# Patient Record
Sex: Male | Born: 1960 | Race: Black or African American | Hispanic: No | Marital: Single | Smoking: Current some day smoker
Health system: Southern US, Community
[De-identification: ages and names within clinical notes are randomized; demographics above are authoritative.]

## PROBLEM LIST (undated history)

## (undated) DIAGNOSIS — I10 Essential (primary) hypertension: Secondary | ICD-10-CM

## (undated) HISTORY — PX: KNEE ARTHROCENTESIS: SUR44

## (undated) HISTORY — PX: BACK SURGERY: SHX140

---

## 1998-03-06 ENCOUNTER — Encounter: Payer: Self-pay | Admitting: Emergency Medicine

## 1998-03-06 ENCOUNTER — Emergency Department (HOSPITAL_COMMUNITY): Admission: EM | Admit: 1998-03-06 | Discharge: 1998-03-06 | Payer: Self-pay | Admitting: Emergency Medicine

## 1998-08-13 ENCOUNTER — Encounter: Payer: Self-pay | Admitting: Emergency Medicine

## 1998-08-13 ENCOUNTER — Emergency Department (HOSPITAL_COMMUNITY): Admission: EM | Admit: 1998-08-13 | Discharge: 1998-08-13 | Payer: Self-pay | Admitting: *Deleted

## 2002-06-19 ENCOUNTER — Emergency Department (HOSPITAL_COMMUNITY): Admission: EM | Admit: 2002-06-19 | Discharge: 2002-06-19 | Payer: Self-pay | Admitting: Emergency Medicine

## 2002-07-01 ENCOUNTER — Emergency Department (HOSPITAL_COMMUNITY): Admission: EM | Admit: 2002-07-01 | Discharge: 2002-07-01 | Payer: Self-pay | Admitting: Emergency Medicine

## 2002-08-06 ENCOUNTER — Encounter: Payer: Self-pay | Admitting: Emergency Medicine

## 2002-08-06 ENCOUNTER — Emergency Department (HOSPITAL_COMMUNITY): Admission: EM | Admit: 2002-08-06 | Discharge: 2002-08-06 | Payer: Self-pay | Admitting: Emergency Medicine

## 2002-12-30 ENCOUNTER — Emergency Department (HOSPITAL_COMMUNITY): Admission: EM | Admit: 2002-12-30 | Discharge: 2002-12-30 | Payer: Self-pay | Admitting: Emergency Medicine

## 2008-01-24 ENCOUNTER — Emergency Department (HOSPITAL_COMMUNITY): Admission: EM | Admit: 2008-01-24 | Discharge: 2008-01-24 | Payer: Self-pay | Admitting: Emergency Medicine

## 2009-05-27 ENCOUNTER — Emergency Department (HOSPITAL_COMMUNITY): Admission: EM | Admit: 2009-05-27 | Discharge: 2009-05-27 | Payer: Self-pay | Admitting: Emergency Medicine

## 2010-03-09 ENCOUNTER — Inpatient Hospital Stay (HOSPITAL_COMMUNITY)
Admission: EM | Admit: 2010-03-09 | Discharge: 2010-03-12 | DRG: 313 | Disposition: A | Discharge status: Home / Self Care | Payer: Self-pay | Attending: Internal Medicine | Admitting: Internal Medicine

## 2010-03-09 ENCOUNTER — Inpatient Hospital Stay (HOSPITAL_COMMUNITY): Payer: Self-pay

## 2010-03-09 ENCOUNTER — Emergency Department (HOSPITAL_COMMUNITY)
Admission: EM | Admit: 2010-03-09 | Discharge: 2010-03-09 | Discharge status: Against Medical Advice | Payer: Self-pay | Attending: Emergency Medicine | Admitting: Emergency Medicine

## 2010-03-09 ENCOUNTER — Emergency Department (HOSPITAL_COMMUNITY): Payer: Self-pay

## 2010-03-09 DIAGNOSIS — M25519 Pain in unspecified shoulder: Secondary | ICD-10-CM | POA: Diagnosis present

## 2010-03-09 DIAGNOSIS — M6282 Rhabdomyolysis: Secondary | ICD-10-CM | POA: Diagnosis present

## 2010-03-09 DIAGNOSIS — I10 Essential (primary) hypertension: Secondary | ICD-10-CM | POA: Insufficient documentation

## 2010-03-09 DIAGNOSIS — W2209XA Striking against other stationary object, initial encounter: Secondary | ICD-10-CM | POA: Diagnosis present

## 2010-03-09 DIAGNOSIS — R0789 Other chest pain: Principal | ICD-10-CM | POA: Diagnosis present

## 2010-03-09 DIAGNOSIS — IMO0002 Reserved for concepts with insufficient information to code with codable children: Secondary | ICD-10-CM | POA: Diagnosis present

## 2010-03-09 DIAGNOSIS — R55 Syncope and collapse: Secondary | ICD-10-CM | POA: Diagnosis present

## 2010-03-09 DIAGNOSIS — R079 Chest pain, unspecified: Secondary | ICD-10-CM | POA: Insufficient documentation

## 2010-03-09 DIAGNOSIS — M1990 Unspecified osteoarthritis, unspecified site: Secondary | ICD-10-CM | POA: Diagnosis present

## 2010-03-09 LAB — CBC
HCT: 43.9 % (ref 39.0–52.0)
HCT: 42.6 % (ref 39.0–52.0)
Hemoglobin: 15 g/dL (ref 13.0–17.0)
MCH: 28.7 pg (ref 26.0–34.0)
MCH: 27.7 pg (ref 26.0–34.0)
MCH: 27.7 pg (ref 26.0–34.0)
MCHC: 34.2 g/dL (ref 30.0–36.0)
MCHC: 33.3 g/dL (ref 30.0–36.0)
MCV: 83.9 fL (ref 78.0–100.0)
MCV: 84.4 fL (ref 78.0–100.0)
Platelets: 176 10*3/uL (ref 150–400)
Platelets: 172 10*3/uL (ref 150–400)
Platelets: 177 10*3/uL (ref 150–400)
RBC: 5.23 MIL/uL (ref 4.22–5.81)
RDW: 13.1 % (ref 11.5–15.5)
RDW: 13.1 % (ref 11.5–15.5)
RDW: 13.1 % (ref 11.5–15.5)
WBC: 6.6 10*3/uL (ref 4.0–10.5)

## 2010-03-09 LAB — DIFFERENTIAL
Basophils Absolute: 0 10*3/uL (ref 0.0–0.1)
Basophils Relative: 0 % (ref 0–1)
Basophils Relative: 0 % (ref 0–1)
Eosinophils Absolute: 0.1 10*3/uL (ref 0.0–0.7)
Eosinophils Absolute: 0.1 10*3/uL (ref 0.0–0.7)
Eosinophils Relative: 1 % (ref 0–5)
Eosinophils Relative: 1 % (ref 0–5)
Lymphocytes Relative: 29 % (ref 12–46)
Lymphs Abs: 1.9 10*3/uL (ref 0.7–4.0)
Monocytes Absolute: 0.5 10*3/uL (ref 0.1–1.0)
Monocytes Absolute: 0.5 10*3/uL (ref 0.1–1.0)
Monocytes Relative: 8 % (ref 3–12)
Monocytes Relative: 7 % (ref 3–12)
Neutro Abs: 4.1 10*3/uL (ref 1.7–7.7)
Neutrophils Relative %: 63 % (ref 43–77)
Neutrophils Relative %: 56 % (ref 43–77)

## 2010-03-09 LAB — COMPREHENSIVE METABOLIC PANEL
Albumin: 3.7 g/dL (ref 3.5–5.2)
Alkaline Phosphatase: 67 U/L (ref 39–117)
BUN: 18 mg/dL (ref 6–23)
BUN: 14 mg/dL (ref 6–23)
Chloride: 109 mEq/L (ref 96–112)
Creatinine, Ser: 0.99 mg/dL (ref 0.4–1.5)
Glucose, Bld: 128 mg/dL — ABNORMAL HIGH (ref 70–99)
Glucose, Bld: 131 mg/dL — ABNORMAL HIGH (ref 70–99)
Potassium: 3.9 mEq/L (ref 3.5–5.1)
Total Bilirubin: 0.5 mg/dL (ref 0.3–1.2)
Total Protein: 7.5 g/dL (ref 6.0–8.3)
Total Protein: 6.8 g/dL (ref 6.0–8.3)

## 2010-03-09 LAB — CARDIAC PANEL(CRET KIN+CKTOT+MB+TROPI)
CK, MB: 5.4 ng/mL — ABNORMAL HIGH (ref 0.3–4.0)
Total CK: 2740 U/L — ABNORMAL HIGH (ref 7–232)
Troponin I: 0.01 ng/mL (ref 0.00–0.06)

## 2010-03-09 LAB — POCT CARDIAC MARKERS
CKMB, poc: 3.4 ng/mL (ref 1.0–8.0)
Myoglobin, poc: >500 ng/mL (ref 12–200)
Myoglobin, poc: >500 ng/mL (ref 12–200)

## 2010-03-09 LAB — BASIC METABOLIC PANEL
BUN: 14 mg/dL (ref 6–23)
CO2: 25 mEq/L (ref 19–32)
Chloride: 107 mEq/L (ref 96–112)
Creatinine, Ser: 1.02 mg/dL (ref 0.4–1.5)

## 2010-03-09 LAB — PHOSPHORUS: Phosphorus: 4.4 mg/dL (ref 2.3–4.6)

## 2010-03-09 MED ORDER — IOHEXOL 300 MG/ML  SOLN
100.0000 mL | Freq: Once | INTRAMUSCULAR | Status: AC | PRN
  Administered 2010-03-09: 100 mL via INTRAVENOUS

## 2010-03-10 LAB — COMPREHENSIVE METABOLIC PANEL
ALT: 18 U/L (ref 0–53)
AST: 39 U/L — ABNORMAL HIGH (ref 0–37)
Albumin: 3.8 g/dL (ref 3.5–5.2)
Alkaline Phosphatase: 67 U/L (ref 39–117)
BUN: 13 mg/dL (ref 6–23)
CO2: 25 mEq/L (ref 19–32)
Calcium: 8.8 mg/dL (ref 8.4–10.5)
Chloride: 106 mEq/L (ref 96–112)
Creatinine, Ser: 0.9 mg/dL (ref 0.4–1.5)
GFR calc Af Amer: >60 mL/min (ref >60)
GFR calc non Af Amer: >60 mL/min (ref >60)
Glucose, Bld: 123 mg/dL — ABNORMAL HIGH (ref 70–99)
Potassium: 3.9 mEq/L (ref 3.5–5.1)
Sodium: 136 mEq/L (ref 135–145)
Total Bilirubin: 0.5 mg/dL (ref 0.3–1.2)
Total Protein: 6.5 g/dL (ref 6.0–8.3)

## 2010-03-10 LAB — LIPID PANEL
Cholesterol: 151 mg/dL (ref 0–200)
HDL: 31 mg/dL — ABNORMAL LOW (ref >39)
LDL Cholesterol: 88 mg/dL (ref 0–99)
Total CHOL/HDL Ratio: 4.9 RATIO
Triglycerides: 159 mg/dL — ABNORMAL HIGH (ref <150)
VLDL: 32 mg/dL (ref 0–40)

## 2010-03-10 LAB — CARDIAC PANEL(CRET KIN+CKTOT+MB+TROPI)
CK, MB: 4.8 ng/mL — ABNORMAL HIGH (ref 0.3–4.0)
CK, MB: 3.8 ng/mL (ref 0.3–4.0)
Relative Index: 0.2 (ref 0.0–2.5)
Relative Index: 0.2 (ref 0.0–2.5)
Total CK: 2777 U/L — ABNORMAL HIGH (ref 7–232)
Troponin I: 0.01 ng/mL (ref 0.00–0.06)
Troponin I: 0.01 ng/mL (ref 0.00–0.06)

## 2010-03-10 LAB — CBC
HCT: 42 % (ref 39.0–52.0)
Hemoglobin: 13.9 g/dL (ref 13.0–17.0)
MCH: 27.6 pg (ref 26.0–34.0)
MCHC: 33.1 g/dL (ref 30.0–36.0)
MCV: 83.5 fL (ref 78.0–100.0)
Platelets: 180 10*3/uL (ref 150–400)
RBC: 5.03 MIL/uL (ref 4.22–5.81)
RDW: 13.1 % (ref 11.5–15.5)
WBC: 8.6 10*3/uL (ref 4.0–10.5)

## 2010-03-10 LAB — PHOSPHORUS: Phosphorus: 4.5 mg/dL (ref 2.3–4.6)

## 2010-03-10 LAB — MAGNESIUM: Magnesium: 2.1 mg/dL (ref 1.5–2.5)

## 2010-03-11 LAB — CBC
HCT: 40.7 % (ref 39.0–52.0)
MCH: 27.3 pg (ref 26.0–34.0)
MCV: 84.3 fL (ref 78.0–100.0)
Platelets: 165 10*3/uL (ref 150–400)
RDW: 12.9 % (ref 11.5–15.5)
WBC: 5.7 10*3/uL (ref 4.0–10.5)

## 2010-03-11 LAB — BASIC METABOLIC PANEL
BUN: 10 mg/dL (ref 6–23)
Chloride: 105 mEq/L (ref 96–112)
Creatinine, Ser: 0.99 mg/dL (ref 0.4–1.5)
GFR calc non Af Amer: >60 mL/min (ref >60)
Glucose, Bld: 112 mg/dL — ABNORMAL HIGH (ref 70–99)
Potassium: 4.2 mEq/L (ref 3.5–5.1)

## 2010-03-12 LAB — BASIC METABOLIC PANEL
BUN: 11 mg/dL (ref 6–23)
CO2: 25 mEq/L (ref 19–32)
Chloride: 106 mEq/L (ref 96–112)
Glucose, Bld: 112 mg/dL — ABNORMAL HIGH (ref 70–99)
Potassium: 3.7 mEq/L (ref 3.5–5.1)

## 2010-03-17 NOTE — H&P (Signed)
NAME:  Marcus Wall, Marcus Wall              ACCOUNT NO.:  [id number]  MEDICAL RECORD NO.:  [id number]           PATIENT TYPE:  E  LOCATION:  WLED                         FACILITY:  [facility]  PHYSICIAN:  Treasure Ochs I Floyd Wade, MD      DATE OF BIRTH:  [birth year]  DATE OF ADMISSION:  03/09/2010 DATE OF DISCHARGE:                             HISTORY & PHYSICAL   CHIEF COMPLAINT:  Chest pain and presyncope.  HISTORY OF PRESENT ILLNESS:  This is a 50 year old gentleman with no previous medical history.  As he mentioned, he last saw an M.D. about 2 years ago at a checkup and there was no problem with him.  He had an argument today with his girlfriend and then he developed sudden chest pain associated with shortness of breath.  In a minute, he felt like he was presyncopal and collapse on the floor, but he did not.  He actually hit his left shoulder on the truck and he had to hold his body onto the floor.  He fell on his knees.  After that, the patient noticed left chest pain, persistent, and getting worse.  The patient was 8/10, radiating to his left arm, associated with some sweating but no nausea or vomiting and no shortness of breath.  The patient was seen in the emergency room and was advised to be admitted for further evaluation, but the patient signed AMA.  The patient went home, drove his girlfriend home, and then suddenly he had another recurrence of his chest pain. The pain was mainly on his left side of his chest, associated with difficulty lifting his left shoulder, mainly secondary to pain and numbness.  The patient denies any weakness of his left arm.  He cannot lift his left shoulder secondary to pain.  The patient admitted his pain has resolved with some morphine and the nitroglycerin given in the emergency room.  The patient received 2 baby aspirins.  We were asked to admit for further evaluation of his chest pain.  PAST MEDICAL HISTORY:  None.  ALLERGIES:  None.  PAST SURGICAL HISTORY:   History of right knee surgery.  FAMILY HISTORY:  Positive for diabetes and glaucoma.  Denies any history of heart attack on his mother or father side.  REVIEW OF SYSTEMS:  The patient denies any blurring of vision.  Denies any weakness or numbness.  Denies any seizures.  Denies any syncope.  He felt dizzy and he hit his left shoulder on the truck and he fell on the ground, but he denies any loss of consciousness. Cardiovascular/respiratory:  Complains of chest pain, 8/10.  Denies any shortness of breath.  Denies any palpitations.  Denies any orthopnea or paroxysmal nocturnal dyspnea.  Denies any lower extremity swelling. Denies any cough.  Denies any fever.  Abdomen:  Denies any vomiting but complains of some nausea.  Denies any change in bowel habits. Extremities:  Denies any lower limb edema and denies any hematuria or dysuria.  PHYSICAL EXAMINATION:  VITAL SIGNS:  Temperature 98.7, blood pressure 162/114, pulse 91, respiratory rate 27, saturating 98%. HEENT:  Normocephalic, atraumatic.  Pupils equal, round and reactive tolight  and accommodation.  Extraocular muscle movement was normal. NECK:  Supple.  No lymphadenopathy.  No masses. HEART:  S1, S2 with no added sounds. ABDOMEN:  Soft, nontender.  Bowel sounds positive.  No organomegaly. EXTREMITIES:  No lower limb edema.  Peripheral pulses intact.  PERTINENT LABORATORY AND X-RAY DATA:  EKG showed normal sinus rhythm with some PVCs at a rate of 91.  Blood workup for cardiac markers was negative.  CBC:  White blood cells 7, hemoglobin 15.1, hematocrit 45.3, platelets 172.  CMET:  Sodium 140, potassium 3.8, chloride 107, CO2 of 25, glucose 103, BUN 14, creatinine 1.02, calcium 9.2.  LFTs within normal limits.  Chest x-ray:  No cardiopulmonary finding.  ASSESSMENT: 1. Chest pain. 2. Presyncope. 3. Left shoulder pain, possibly secondary to strain. 4. Uncontrolled hypertension.  PLAN:  The patient will be admitted to telemetry.   The patient had a presyncopal episode; cause is not clear.  Will get cardiac enzymes. Will get a 2D echo.  Will also proceed with CT angiogram of the chest to rule out any aortic dissection, although in his case unlikely.  Will provide the patient with labetalol 100 mg p.o. b.i.d. for controlling his blood pressure and adding IV labetalol p.r.n. for systolic above 180.  Will provide the patient with aspirin.  His left shoulder pain was probably secondary to hitting his body against the truck, which could be strained.  The patient is unable to lift his left shoulder more than 30 degrees.  He cannot do an extension, but there is no evidence of any weakness.  Will provide the patient with some pain medication, mainly Percocet and morphine.  We will also get a shoulder x-ray.  We will get a lipid profile and hemoglobin A1c for further risk stratification. Further recommendations as hospital course progresses.     Morgana Rowley Bosie Helper, MD     HIE/MEDQ  D:  03/09/2010  T:  03/09/2010  Job:  [id number]  Electronically Signed by Ebony Cargo MD on 03/16/2010 05:09:33 PM

## 2010-03-24 NOTE — Discharge Summary (Signed)
Marcus Wall, PIPE NO.:  [id number]  MEDICAL RECORD NO.:  [id number]           PATIENT TYPE:  I  LOCATION:  1415                         FACILITY:  [facility]  PHYSICIAN:  Ramiro Harvest, MD    DATE OF BIRTH:  May 10, 1960  DATE OF ADMISSION:  03/09/2010 DATE OF DISCHARGE:  03/12/2010                        DISCHARGE SUMMARY   PRIMARY CARE PHYSICIAN:  Marcus Wall, M.D.  DISCHARGE DIAGNOSES: 1. Chest pain, improved, unknown etiology, may be musculoskeletal in     nature. 2. Uncontrolled hypertension. 3. Left shoulder pain secondary to degenerative joint disease/sprain,     improved. 4. Mild rhabdomyolysis, improved.  DISCHARGE MEDICATIONS: 1. Hydrocodone/APAP 5/325 one to two tablets p.o. q.4h. p.r.n. 2. Ibuprofen 800 mg p.o. t.i.d. x3 days, then stop. 3. Lisinopril 20 mg p.o. daily. 4. Metoprolol 25 mg p.o. b.i.d.  DISPOSITION AND FOLLOWUP:  The patient will be discharged home.  The patient is to follow up with his PCP.  The patient states that he is going to pick Dr. Concepcion Wall as his PCP.  He needs to schedule a followup appointment this week.  The patient's blood pressure will need to be reassessed.  The patient has been started on lisinopril and metoprolol. The patient will need a repeat BMET done on followup to follow up on his electrolytes and renal function.  If the patient still has persistent uncontrolled hypertension, may consider renal duplex to rule out renal artery stenosis.  The patient will also be called with an appointment to be evaluated per [facility] as an outpatient for possible outpatient stress test for outpatient further workup of his chest pain.  CONSULTATIONS DONE:  None.  PROCEDURES PERFORMED: 1. A chest x-ray was done March 09, 2010, that showed no acute     cardiopulmonary findings. 2. X-ray of the left shoulder was done March 09, 2010, that showed     no acute finding.  AC joint degenerative disease. 3. CT  angiogram of the chest was done March 09, 2010, that showed     no evidence of acute pulmonary thromboembolism, bibasilar     atelectasis. 4. A 2-D echocardiogram was done on March 10, 2010, that showed a     normal left ventricular cavity size, wall thickness was increased     in a pattern of mild to moderate LVH, systolic function was normal,     EF of 60-65%, wall motion was normal.  There were no regional wall     motion abnormalities.  BRIEF ADMISSION HISTORY AND PHYSICAL:  [name] is a 50 year old African American gentleman with no previous past medical history. As mentioned, he last saw a MD 2 years ago at a checkup with no problems.  The patient had an argument on the day of admission with his girlfriend and he developed sudden chest pain associated with shortness of breath.  in a minute, he felt like he was presyncopal and collapsed on the floor and thought he might collapse on the floor but he did not. The patient actually hit his left shoulder on his truck, and he had to hold his body on to the floor  and he fell on his knees.  After that, the patient noticed left-sided chest pain, persistent and worsening.  The patient had a 8/10 pain radiating to his left arm associated with some sweating but no nausea, no vomiting, and no shortness of breath.  The patient was seen in emergency room, was advised to be admitted for further evaluation but left AMA.  The patient went home, drove his girlfriend home, and then suddenly he had another recurrence of his chest pain.  Pain was mainly on the left side of his chest associated with difficulty lifting his left shoulder may be secondary to pain and numbness.  The patient denied any weakness of the left arm.  Cannot lift his left shoulder secondary to pain.  The patient admitted his pain had resolved with some morphine and nitroglycerin which was given in the emergency room.  The patient received 2 baby aspirin, and we are  asked to admit the patient for further evaluation of his chest pain.  For the rest of admission history and physical, please see H and P dictated by Dr. Eda Paschal of job number [id number].  HOSPITAL COURSE: 1. Chest pain.  The patient was admitted with a chest pain.  He was     placed on telemetry, and cardiac enzymes were cycled.  Cardiac     enzymes which were cycled were negative x3.  A CT angiogram of the     chest was done to rule out aortic dissection which was negative.     The patient was initially placed on labetalol 100 mg twice daily     for blood pressure control, and he was also placed on IV labetalol     as needed.  The patient was given 2 doses of aspirin.  The patient     was monitored on the floor.  On followup during the     hospitalization, it was noted that the patient's chest pain was     somewhat reproducible and subsequently, the patient was placed on     ibuprofen 800 mg 3 times daily scheduled.  A 2-D echo was also     obtained with results as stated above which did show mild to     moderate LVH, normal left ventricular cavity size, EF of 60-65%,     and there was no regional wall motion abnormality.  The patient's     chest pain improved during the hospitalization and it resolved by     the day of discharge.  The patient will be discharged home, and he     will be called per [facility] for further evaluation and     workup as an outpatient for possible outpatient stress test.  The     patient will be discharged home on 3 more days of scheduled     ibuprofen and will be discharged home in stable and improved     condition.  Will need to follow up with his PCP as an outpatient.     Lipid profile which was obtained during this hospitalization did     show a total cholesterol of 151, triglycerides of 159, HDL of 31,     and LDL of 88.  Hemoglobin A1c which was obtained did show an A1c     of 6.1. 2. Uncontrolled hypertension.  On admission, the patient was noted  to     have blood pressure of 162/114.  The patient was noted to be     hypertensive and  initially placed on labetalol 100 mg twice daily.     The patient's labetalol was subsequently changed to oral Lopressor     for blood pressure control.  The patient's blood pressure     fluctuated during the hospitalization going up as high as the low     200s.  He was maintained on Lopressor 25 mg orally daily.     Lisinopril was added to his regimen, and the patient was maintained     on 20 mg of lisinopril as well as Lopressor 25 mg twice a day.  The     patient's blood pressure improved such that by day of discharge,     the patient's blood pressure was 158/96.  A 2-D echo was done with     results as stated above.  The patient did have a normal renal     function.  The patient will be discharged home in stable and     improved condition on lisinopril 20 mg daily as well as Lopressor     25 mg twice daily.  The patient will need to follow up with his PCP     as an outpatient this week for further management of his blood     pressure.  If the patient's blood pressure remains difficult to     control, may consider outpatient renal duplex to rule out renal     artery stenosis as the patient did state that his mother did have a     history of hard to control blood pressure.  The patient will be     discharged home in stable and improved condition. 3. Left shoulder pain which was felt to be secondary to a sprain     versus degenerative joint disease.  X-ray of the left shoulder     which was done on admission with results as stated above, showed no     acute finding.  However did show AC joint degenerative disease.     The patient was seen by Physical Therapy during the hospitalization     and was given some exercises for his left shoulder pain.  The     patient also underwent pain management with pain medication during     the hospitalization.  The patient's left shoulder pain has     improved.  He  will be discharged home in stable and improved     condition. 4. Mild rhabdomyolysis.  On admission, the patient was noted to have     mild rhabdomyolysis.  His CK was elevated and went up as high as     2777.  The patient was hydrated with IV fluids with improvement     with his CK going down to 1783 his total CK.  The patient will be     discharged in stable and improved condition to follow up with his     PCP as an outpatient.  DISCHARGE VITAL SIGNS:  Temperature 98.2, pulse of 86, blood pressure 158/96, respirations 18, and satting 96% on room air.  DISCHARGE LABS:  Sodium 137, potassium 3.7, chloride 106, bicarb 25, glucose 112, BUN 11, creatinine 0.92, and calcium of 9.4.  Lipid profile; total cholesterol 151, triglycerides 159, HDL 31, LDL 88, hemoglobin A1c of 6.1.  CBC with a white count of 5.7, hemoglobin 13.2, hematocrit 40.7, and a platelet count of 165,000.  It was a pleasure taking care of [name].     Ramiro Harvest, MD  DT/MEDQ  D:  03/12/2010  T:  03/12/2010  Job:  [id number]  cc:   Marcus Wall, M.D. Fax: [phone]  [facility]  Electronically Signed by Ramiro Harvest MD on 03/24/2010 08:09:05 PM

## 2010-05-18 ENCOUNTER — Inpatient Hospital Stay (INDEPENDENT_AMBULATORY_CARE_PROVIDER_SITE_OTHER)
Admission: RE | Admit: 2010-05-18 | Discharge: 2010-05-18 | Disposition: A | Discharge status: Home / Self Care | Payer: Self-pay | Source: Ambulatory Visit | Attending: Family Medicine | Admitting: Family Medicine

## 2010-05-18 DIAGNOSIS — L02818 Cutaneous abscess of other sites: Secondary | ICD-10-CM

## 2010-05-18 DIAGNOSIS — I10 Essential (primary) hypertension: Secondary | ICD-10-CM

## 2010-05-18 DIAGNOSIS — L738 Other specified follicular disorders: Secondary | ICD-10-CM

## 2010-05-18 LAB — POCT I-STAT, CHEM 8
BUN: 16 mg/dL (ref 6–23)
Calcium, Ion: 1.18 mmol/L (ref 1.12–1.32)
Creatinine, Ser: 1.1 mg/dL (ref 0.4–1.5)
Glucose, Bld: 109 mg/dL — ABNORMAL HIGH (ref 70–99)
TCO2: 26 mmol/L (ref 0–100)

## 2012-11-04 ENCOUNTER — Encounter (HOSPITAL_COMMUNITY): Payer: Self-pay | Admitting: Emergency Medicine

## 2012-11-04 ENCOUNTER — Emergency Department (HOSPITAL_COMMUNITY): Payer: Self-pay

## 2012-11-04 ENCOUNTER — Emergency Department (HOSPITAL_COMMUNITY)
Admission: EM | Admit: 2012-11-04 | Discharge: 2012-11-04 | Disposition: A | Discharge status: Home / Self Care | Payer: Self-pay | Attending: Emergency Medicine | Admitting: Emergency Medicine

## 2012-11-04 DIAGNOSIS — Y939 Activity, unspecified: Secondary | ICD-10-CM | POA: Insufficient documentation

## 2012-11-04 DIAGNOSIS — S63509A Unspecified sprain of unspecified wrist, initial encounter: Secondary | ICD-10-CM | POA: Insufficient documentation

## 2012-11-04 DIAGNOSIS — Y929 Unspecified place or not applicable: Secondary | ICD-10-CM | POA: Insufficient documentation

## 2012-11-04 DIAGNOSIS — W19XXXA Unspecified fall, initial encounter: Secondary | ICD-10-CM | POA: Insufficient documentation

## 2012-11-04 DIAGNOSIS — F17200 Nicotine dependence, unspecified, uncomplicated: Secondary | ICD-10-CM | POA: Insufficient documentation

## 2012-11-04 MED ORDER — IBUPROFEN 600 MG PO TABS: 600.0000 mg | ORAL_TABLET | Freq: Four times a day (QID) | ORAL | Status: DC | PRN

## 2012-11-04 MED ORDER — IBUPROFEN 800 MG PO TABS
800.0000 mg | ORAL_TABLET | Freq: Once | ORAL | Status: AC
  Administered 2012-11-04: 800 mg via ORAL
  Filled 2012-11-04: qty 1

## 2012-11-04 MED ORDER — IBUPROFEN 800 MG PO TABS: 800.0000 mg | ORAL_TABLET | Freq: Once | ORAL | Status: DC

## 2012-11-04 NOTE — ED Provider Notes (Signed)
CSN: [id number]     Arrival date & time 11/04/12  0451 History   First MD Initiated Contact with Patient 11/04/12 [phone]     Chief Complaint  Patient presents with  . Wrist Pain   (Consider location/radiation/quality/duration/timing/severity/associated sxs/prior Treatment) HPI  Marcus Wall is a 52 y.o.male without any significant PMH presents to the ER with complaints of right wrist pain. Last night he accidentally feel onto it bending it backwards. He went to bed and woke up at 12:30 this morning with it hurting extremely bad. After being unable to sleep with pain he decided to come to the ER. He took some Tylenol at home. He does not want to move it because of the pain and feels that it is swollen. Denies hitting his head or injuring his neck. No LOC. nad/vss   History reviewed. No pertinent past medical history. Past Surgical History  Procedure Laterality Date  . Knee arthrocentesis     History reviewed. No pertinent family history. History  Substance Use Topics  . Smoking status: Current Every Day Smoker    Types: Cigars  . Smokeless tobacco: Not on file  . Alcohol Use: No    Review of Systems The patient denies anorexia, fever, weight loss,, vision loss, decreased hearing, hoarseness, chest pain, syncope, dyspnea on exertion, peripheral edema, balance deficits, hemoptysis, abdominal pain, melena, hematochezia, severe indigestion/heartburn, hematuria, incontinence, genital sores, muscle weakness, suspicious skin lesions, transient blindness, difficulty walking, depression, unusual weight change, abnormal bleeding, enlarged lymph nodes, angioedema, and breast masses.  Allergies  Review of patient's allergies indicates no known allergies.  Home Medications  No current outpatient prescriptions on file. BP 154/96  Pulse 65  Temp(Src) 97.9 F (36.6 C) (Oral)  Resp 17  Ht 6\' 8"  (2.032 m)  Wt 295 lb (133.811 kg)  BMI 32.41 kg/m2  SpO2 99% Physical Exam  Nursing  note and vitals reviewed. Constitutional: He appears well-developed and well-nourished. No distress.  HENT:  Head: Normocephalic and atraumatic.  Eyes: Pupils are equal, round, and reactive to light.  Neck: Normal range of motion. Neck supple.  Cardiovascular: Normal rate and regular rhythm.   Pulmonary/Chest: Effort normal.  Abdominal: Soft.  Musculoskeletal:       Right wrist: He exhibits decreased range of motion, tenderness, bony tenderness and swelling. He exhibits no effusion, no crepitus, no deformity and no laceration.  Neurological: He is alert.  Skin: Skin is warm and dry.    ED Course  Procedures (including critical care time) Labs Review Labs Reviewed - No data to display Imaging Review Dg Wrist Complete Right  11/04/2012   CLINICAL DATA:  Fall, pain.  EXAM: RIGHT HAND - COMPLETE 3+ VIEW; RIGHT WRIST - COMPLETE 3+ VIEW  COMPARISON:  None available for comparison at time of study interpretation.  FINDINGS: No acute fracture deformity or dislocation. 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 Joint space intact without erosions. No destructive bony lesions. Soft tissue planes are not suspicious.  IMPRESSION: No acute fracture deformity, nor dislocation within the right hand nor wrist.   Electronically Signed   By: Awilda Metro   On: 11/04/2012 05:48   Dg Hand Complete Right  11/04/2012   CLINICAL DATA:  Fall, pain.  EXAM: RIGHT HAND - COMPLETE 3+ VIEW; RIGHT WRIST - COMPLETE 3+ VIEW  COMPARISON:  None available for comparison at time of study interpretation.  FINDINGS: No acute fracture deformity or dislocation. 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 Joint space intact without erosions. No destructive bony  lesions. Soft tissue planes are not suspicious.  IMPRESSION: No acute fracture deformity, nor dislocation within the right hand nor wrist.   Electronically Signed   By: Awilda Metro   On: 11/04/2012 05:48    EKG Interpretation   None        MDM   1. Wrist sprain and strain, right, initial encounter    No fracture to right wrist. Ibuprofen/ice given in ED.  Pt placed in right wrist splint (velcro) and given referral to Hand.  52 y.o.Marcus Wall's evaluation in the Emergency Department is complete. It has been determined that no acute conditions requiring further emergency intervention are present at this time. The patient/guardian have been advised of the diagnosis and plan. We have discussed signs and symptoms that warrant return to the ED, such as changes or worsening in symptoms.  Vital signs are stable at discharge. Filed Vitals:   11/04/12 0456  BP: 154/96  Pulse: 65  Temp: 97.9 F (36.6 C)  Resp: 17    Patient/guardian has voiced understanding and agreed to follow-up with the PCP or specialist.     Dorthula Matas, PA-C 11/04/12 1191

## 2012-11-04 NOTE — ED Provider Notes (Signed)
Medical screening examination/treatment/procedure(s) were performed by non-physician practitioner and as supervising physician I was immediately available for consultation/collaboration.    Darshawn Boateng M Yenny Kosa, MD 11/04/12 0657 

## 2012-11-04 NOTE — ED Notes (Signed)
MD at bedside. 

## 2012-11-04 NOTE — ED Notes (Signed)
Patient states he stumbled and fell onto right hand.  Complaining of right hand and wrist pain.

## 2013-11-14 ENCOUNTER — Encounter (HOSPITAL_COMMUNITY): Payer: Self-pay | Admitting: Emergency Medicine

## 2013-11-14 ENCOUNTER — Emergency Department (HOSPITAL_COMMUNITY)
Admission: EM | Admit: 2013-11-14 | Discharge: 2013-11-14 | Disposition: A | Discharge status: Home / Self Care | Payer: Self-pay | Attending: Emergency Medicine | Admitting: Emergency Medicine

## 2013-11-14 ENCOUNTER — Emergency Department (HOSPITAL_COMMUNITY)
Admission: EM | Admit: 2013-11-14 | Discharge: 2013-11-14 | Discharge status: Against Medical Advice | Payer: Self-pay | Attending: Emergency Medicine | Admitting: Emergency Medicine

## 2013-11-14 ENCOUNTER — Emergency Department (HOSPITAL_COMMUNITY): Payer: Self-pay

## 2013-11-14 ENCOUNTER — Encounter (HOSPITAL_COMMUNITY): Payer: Self-pay | Admitting: *Deleted

## 2013-11-14 DIAGNOSIS — R519 Headache, unspecified: Secondary | ICD-10-CM

## 2013-11-14 DIAGNOSIS — R51 Headache: Secondary | ICD-10-CM | POA: Insufficient documentation

## 2013-11-14 DIAGNOSIS — R030 Elevated blood-pressure reading, without diagnosis of hypertension: Secondary | ICD-10-CM | POA: Insufficient documentation

## 2013-11-14 DIAGNOSIS — Z720 Tobacco use: Secondary | ICD-10-CM | POA: Insufficient documentation

## 2013-11-14 MED ORDER — NAPROXEN 500 MG PO TABS: 500.0000 mg | ORAL_TABLET | Freq: Two times a day (BID) | ORAL | Status: DC

## 2013-11-14 MED ORDER — HYDROCODONE-ACETAMINOPHEN 5-325 MG PO TABS: 1.0000 | ORAL_TABLET | ORAL | Status: DC | PRN

## 2013-11-14 MED ORDER — KETOROLAC TROMETHAMINE 30 MG/ML IJ SOLN
30.0000 mg | Freq: Once | INTRAMUSCULAR | Status: AC
  Administered 2013-11-14: 30 mg via INTRAVENOUS
  Filled 2013-11-14: qty 1

## 2013-11-14 MED ORDER — METOCLOPRAMIDE HCL 5 MG/ML IJ SOLN
10.0000 mg | Freq: Once | INTRAMUSCULAR | Status: AC
  Administered 2013-11-14: 10 mg via INTRAVENOUS
  Filled 2013-11-14: qty 2

## 2013-11-14 MED ORDER — MORPHINE SULFATE 4 MG/ML IJ SOLN
6.0000 mg | Freq: Once | INTRAMUSCULAR | Status: AC
Start: 2013-11-14 — End: 2013-11-14
  Administered 2013-11-14: 6 mg via INTRAVENOUS
  Filled 2013-11-14: qty 2

## 2013-11-14 MED ORDER — DIPHENHYDRAMINE HCL 50 MG/ML IJ SOLN
12.5000 mg | Freq: Once | INTRAMUSCULAR | Status: AC
  Administered 2013-11-14: 12.5 mg via INTRAVENOUS
  Filled 2013-11-14: qty 1

## 2013-11-14 NOTE — ED Provider Notes (Signed)
CSN: [id number]     Arrival date & time 11/14/13  0443 History   First MD Initiated Contact with Patient 11/14/13 0458     Chief Complaint  Patient presents with  . Hypertension  . Headache     (Consider location/radiation/quality/duration/timing/severity/associated sxs/prior Treatment) Patient is a 53 y.o. male presenting with hypertension and headaches. The history is provided by the patient. No language interpreter was used.  Hypertension This is a recurrent problem. Associated symptoms include headaches. Pertinent negatives include no chest pain, chills, congestion, fever, nausea, neck pain, vomiting or weakness. Associated symptoms comments: The patient presents with complaint of high blood pressure he feels is caused by significant stress he has been under recently. He also complains of moderate to severe, gradual onset headache located in bilateral temporal region. No visual changes, nausea or vomiting. He does not have a history of headaches but reports he was hospitalized for blood pressure elevation in the past year or so and had a similar headache at that time. He denies requiring antihypertensive medications after discharge from the hospital. .  Headache Associated symptoms: photophobia   Associated symptoms: no congestion, no fever, no nausea, no neck pain, no neck stiffness and no vomiting     History reviewed. No pertinent past medical history. Past Surgical History  Procedure Laterality Date  . Knee arthrocentesis     No family history on file. History  Substance Use Topics  . Smoking status: Current Some Day Smoker    Types: Cigars  . Smokeless tobacco: Not on file  . Alcohol Use: No    Review of Systems  Constitutional: Negative for fever and chills.  HENT: Negative for congestion and facial swelling.   Eyes: Positive for photophobia. Negative for visual disturbance.  Respiratory: Negative.  Negative for shortness of breath.   Cardiovascular: Negative.   Negative for chest pain.  Gastrointestinal: Negative.  Negative for nausea and vomiting.  Musculoskeletal: Negative.  Negative for neck pain and neck stiffness.  Skin: Negative.   Neurological: Positive for headaches. Negative for weakness and light-headedness.  Psychiatric/Behavioral: Negative for confusion.      Allergies  Review of patient's allergies indicates no known allergies.  Home Medications   Prior to Admission medications   Medication Sig Start Date End Date Taking? Authorizing Provider  acetaminophen (TYLENOL) 500 MG tablet Take 1,000 mg by mouth every 6 (six) hours as needed for mild pain.   Yes Historical Provider, MD  ibuprofen (ADVIL,MOTRIN) 600 MG tablet Take 1 tablet (600 mg total) by mouth every 6 (six) hours as needed for pain. 11/04/12  Yes Tiffany G Greene, PA-C   BP 158/102 mmHg  Pulse 83  Temp(Src) 97.9 F (36.6 C) (Oral)  Resp 20  Ht 6\' 8"  (2.032 m)  Wt 295 lb (133.811 kg)  BMI 32.41 kg/m2  SpO2 95% Physical Exam  Constitutional: He is oriented to person, place, and time. He appears well-developed and well-nourished.  HENT:  Head: Normocephalic and atraumatic.  Eyes: EOM are normal. Pupils are equal, round, and reactive to light.  Neck: Normal range of motion.  Cardiovascular: Normal rate and regular rhythm.   No murmur heard. No carotid bruit.  Pulmonary/Chest: Effort normal and breath sounds normal. He has no wheezes. He has no rales.  Abdominal: Soft. There is no tenderness.  Musculoskeletal: Normal range of motion. He exhibits no edema.  Neurological: He is alert and oriented to person, place, and time. He has normal strength and normal reflexes. No cranial nerve deficit or  sensory deficit. He displays a negative Romberg sign. Coordination normal. GCS eye subscore is 4. GCS verbal subscore is 5. GCS motor subscore is 6.    ED Course  Procedures (including critical care time) Labs Review Labs Reviewed - No data to display  Imaging  Review No results found.   EKG Interpretation None      MDM   Final diagnoses:  Headache    Patient care transferred to Dr. Azalia BilisKevin Campos with CT head pending.     Arnoldo HookerShari A Maudie Shingledecker, PA-C 11/14/13 0548  Arnoldo HookerShari A Unnamed Zeien, PA-C 11/14/13 [phone]  Lyanne CoKevin M Campos, MD 11/14/13 [phone]

## 2013-11-14 NOTE — ED Notes (Signed)
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

## 2013-11-14 NOTE — ED Notes (Signed)
Pt presents with a headache for the past day, denies changes in vision- neuro exam negative.  Pt currently alert and oriented X 4.  Pt admits to taking Motrin earlier without relief.  Admits to sensitivity to light and sound.

## 2013-11-14 NOTE — ED Notes (Signed)
Patient c/o headache "from temples to between my eyes. Feels like my blood pressure is up. It's been a real stressful week". Patient c/o "both sides of my neck hurt". Patient c/o sensitivity to light.

## 2015-03-10 ENCOUNTER — Emergency Department (HOSPITAL_COMMUNITY)
Admission: EM | Admit: 2015-03-10 | Discharge: 2015-03-11 | Disposition: A | Discharge status: Home / Self Care | Payer: Self-pay | Attending: Emergency Medicine | Admitting: Emergency Medicine

## 2015-03-10 ENCOUNTER — Encounter (HOSPITAL_COMMUNITY): Payer: Self-pay | Admitting: *Deleted

## 2015-03-10 DIAGNOSIS — F17200 Nicotine dependence, unspecified, uncomplicated: Secondary | ICD-10-CM | POA: Insufficient documentation

## 2015-03-10 DIAGNOSIS — L723 Sebaceous cyst: Secondary | ICD-10-CM | POA: Insufficient documentation

## 2015-03-10 NOTE — ED Notes (Signed)
Pt c/o hard nodule to lower back. Nodule is  Swollen, red and hot to touch. Denies any neurological symptoms.denies hx abscesses.

## 2015-03-11 MED ORDER — HYDROCODONE-ACETAMINOPHEN 5-325 MG PO TABS: 1.0000 | ORAL_TABLET | Freq: Four times a day (QID) | ORAL | Status: DC | PRN

## 2015-03-11 MED ORDER — OXYCODONE-ACETAMINOPHEN 5-325 MG PO TABS
1.0000 | ORAL_TABLET | Freq: Once | ORAL | Status: AC
  Administered 2015-03-11: 1 via ORAL
  Filled 2015-03-11: qty 1

## 2015-03-11 MED ORDER — LIDOCAINE-EPINEPHRINE (PF) 2 %-1:200000 IJ SOLN
20.0000 mL | Freq: Once | INTRAMUSCULAR | Status: AC
  Administered 2015-03-11: 20 mL
  Filled 2015-03-11: qty 20

## 2015-03-11 MED ORDER — SULFAMETHOXAZOLE-TRIMETHOPRIM 800-160 MG PO TABS: 1.0000 | ORAL_TABLET | Freq: Two times a day (BID) | ORAL | Status: AC

## 2015-03-11 NOTE — ED Provider Notes (Signed)
CSN: [id number]     Arrival date & time 03/10/15  2107 History  By signing my name below, I, Marisue Humble, attest that this documentation has been prepared under the direction and in the presence of Shon Baton, MD . Electronically Signed: Marisue Humble, Scribe. 03/11/2015. 1:37 AM.   Chief Complaint  Patient presents with  . Back Pain   The history is provided by the patient. No language interpreter was used.   HPI Comments:  Vern Guerette is a 55 y.o. male with no pertinent PMHx who presents to the Emergency Department complaining of 10/10, gradual onset back pain for the past few days. Pt reports associated painful, sore knot in middle of his back. He notes pain is worse and feels like pins and needles when he lays on his back. No treatments attempted PTA. Denies fever, h/o abscess, or any other medical problems.  History reviewed. No pertinent past medical history. Past Surgical History  Procedure Laterality Date  . Knee arthrocentesis     No family history on file. Social History  Substance Use Topics  . Smoking status: Current Some Day Smoker    Types: Cigars  . Smokeless tobacco: None  . Alcohol Use: No    Review of Systems  Constitutional: Negative for fever.  Musculoskeletal: Positive for back pain.  Skin: Positive for wound (knot on back).  All other systems reviewed and are negative.  Allergies  Review of patient's allergies indicates no known allergies.  Home Medications   Prior to Admission medications   Medication Sig Start Date End Date Taking? Authorizing Provider  acetaminophen (TYLENOL) 500 MG tablet Take 1,000 mg by mouth every 6 (six) hours as needed for mild pain.   Yes Historical Provider, MD  HYDROcodone-acetaminophen (NORCO/VICODIN) 5-325 MG tablet Take 1 tablet by mouth every 6 (six) hours as needed. 03/11/15   Shon Baton, MD  sulfamethoxazole-trimethoprim (BACTRIM DS,SEPTRA DS) 800-160 MG tablet Take 1 tablet by mouth 2 (two)  times daily. 03/11/15 03/18/15  Shon Baton, MD   BP 156/104 mmHg  Pulse 102  Temp(Src) 98.1 F (36.7 C) (Oral)  Resp 18  SpO2 98% Physical Exam  Constitutional: He is oriented to person, place, and time. He appears well-developed and well-nourished.  HENT:  Head: Normocephalic and atraumatic.  Cardiovascular: Normal rate and regular rhythm.   Pulmonary/Chest: Effort normal. No respiratory distress.  Abdominal: Soft.  Musculoskeletal:  4 x 4 centimeter area of fluctuance and induration just right of medial over the lower lumbar region, no overlying skin changes, mild errythema without overt cellulitis, tenderness to palpation  Neurological: He is alert and oriented to person, place, and time.  Skin: Skin is warm and dry.  Psychiatric: He has a normal mood and affect.  Nursing note and vitals reviewed.   ED Course  Procedures  DIAGNOSTIC STUDIES:  Oxygen Saturation is 98% on RA, normal by my interpretation.    COORDINATION OF CARE:  12:31 AM Will perform [place] and administer pain medication. Discussed treatment plan with pt at bedside and pt agreed to plan.  Labs Review Labs Reviewed - No data to display  Imaging Review No results found.   EKG Interpretation None      1:10 AM EMERGENCY DEPARTMENT US SOFT TISSUE INTERPRETATION "Study: Limited Ultrasound of the noted body part in comments below"  INDICATIONS: Soft tissue infection Multiple views of the body part are obtained with a multi-frequency linear probe  PERFORMED BY:  Myself  IMAGES ARCHIVED?: Yes  SIDE:Midline  BODY PART:Lower back  FINDINGS: Abcess present  LIMITATIONS:  Emergent Procedure  INTERPRETATION:  Abcess present  COMMENT:  Abscess 1.8 x 2 x 2.3, likely sebaceous cyst   1:34 AM INCISION AND DRAINAGE  Performed by: Shon Baton, MD Authorized by: Shon Baton, MD  Consent - Verbal Consent obtained Risks and benefits: risks/benefits and alternatives were  discussed  Type: Abscess  Body Area: Back  Anesthesia: Local infiltration Local anesthetic: lidocaine 2%-1:200000 epinephrine  Anesthetic total: 20 ml  Complexity: Complex  Blunt dissection to break up loculations  Drainage: Purulent  Drainage amount: Copious  Packing material: 1/4 iodoform gauze  Patient tolerance: Patient tolerated the procedure well with no immediate complications  1:36 AM Instructed pt to follow up in two days to have the wound rechecked and remove packing.  MDM   Final diagnoses:  Sebaceous cyst    Patient presents with pain and fluctuance over the lower back, bedside ultrasound with evidence of abscess. It was I indeed at the bedside. Contents were purulent and foul-smelling with curdled material. Cystlike wall was apparent. Suspect infected sebaceous cyst.  Packing was placed. Patient will be placed on Bactrim given the contents of the cyst and overlying redness. Recheck urgent care in 2 days.  After history, exam, and medical workup I feel the patient has been appropriately medically screened and is safe for discharge home. Pertinent diagnoses were discussed with the patient. Patient was given return precautions.  I personally performed the services described in this documentation, which was scribed in my presence. The recorded information has been reviewed and is accurate.    Shon Baton, MD 03/11/15 [phone]

## 2015-03-11 NOTE — ED Notes (Signed)
Pt left at this time with all belongings.  

## 2015-03-11 NOTE — Discharge Instructions (Signed)
Epidermal Cyst An epidermal cyst is sometimes called a sebaceous cyst, epidermal inclusion cyst, or infundibular cyst. These cysts usually contain a substance that looks "pasty" or "cheesy" and may have a bad smell. This substance is a protein called keratin. Epidermal cysts are usually found on the face, neck, or trunk. They may also occur in the vaginal area or other parts of the genitalia of both men and women. Epidermal cysts are usually small, painless, slow-growing bumps or lumps that move freely under the skin. It is important not to try to pop them. This may cause an infection and lead to tenderness and swelling. CAUSES  Epidermal cysts may be caused by a deep penetrating injury to the skin or a plugged hair follicle, often associated with acne. SYMPTOMS  Epidermal cysts can become inflamed and cause:  Redness.  Tenderness.  Increased temperature of the skin over the bumps or lumps.  Grayish-white, bad smelling material that drains from the bump or lump. DIAGNOSIS  Epidermal cysts are easily diagnosed by your caregiver during an exam. Rarely, a tissue sample (biopsy) may be taken to rule out other conditions that may resemble epidermal cysts. TREATMENT   Epidermal cysts often get better and disappear on their own. They are rarely ever cancerous.  If a cyst becomes infected, it may become inflamed and tender. This may require opening and draining the cyst. Treatment with antibiotics may be necessary. When the infection is gone, the cyst may be removed with minor surgery.  Small, inflamed cysts can often be treated with antibiotics or by injecting steroid medicines.  Sometimes, epidermal cysts become large and bothersome. If this happens, surgical removal in your caregiver's office may be necessary. HOME CARE INSTRUCTIONS  Only take over-the-counter or prescription medicines as directed by your caregiver.  Take your antibiotics as directed. Finish them even if you start to feel  better. SEEK MEDICAL CARE IF:   Your cyst becomes tender, red, or swollen.  Your condition is not improving or is getting worse.  You have any other questions or concerns. MAKE SURE YOU:  Understand these instructions.  Will watch your condition.  Will get help right away if you are not doing well or get worse.   This information is not intended to replace advice given to you by your health care provider. Make sure you discuss any questions you have with your health care provider.   Document Released: 11/27/2003 Document Revised: 03/20/2011 Document Reviewed: 07/04/2010 Elsevier Interactive Patient Education 2016 Elsevier Inc.  

## 2015-06-01 ENCOUNTER — Emergency Department (HOSPITAL_COMMUNITY)
Admission: EM | Admit: 2015-06-01 | Discharge: 2015-06-01 | Disposition: A | Discharge status: Home / Self Care | Payer: Self-pay | Attending: Emergency Medicine | Admitting: Emergency Medicine

## 2015-06-01 ENCOUNTER — Encounter (HOSPITAL_COMMUNITY): Payer: Self-pay | Admitting: Emergency Medicine

## 2015-06-01 DIAGNOSIS — R197 Diarrhea, unspecified: Secondary | ICD-10-CM | POA: Insufficient documentation

## 2015-06-01 DIAGNOSIS — Z7982 Long term (current) use of aspirin: Secondary | ICD-10-CM | POA: Insufficient documentation

## 2015-06-01 DIAGNOSIS — I10 Essential (primary) hypertension: Secondary | ICD-10-CM

## 2015-06-01 DIAGNOSIS — F17210 Nicotine dependence, cigarettes, uncomplicated: Secondary | ICD-10-CM | POA: Insufficient documentation

## 2015-06-01 DIAGNOSIS — R112 Nausea with vomiting, unspecified: Secondary | ICD-10-CM

## 2015-06-01 LAB — BASIC METABOLIC PANEL
ANION GAP: 5 (ref 5–15)
BUN: 14 mg/dL (ref 6–20)
CHLORIDE: 108 mmol/L (ref 101–111)
CO2: 25 mmol/L (ref 22–32)
Calcium: 9.2 mg/dL (ref 8.9–10.3)
Creatinine, Ser: 1.15 mg/dL (ref 0.61–1.24)
Glucose, Bld: 115 mg/dL — ABNORMAL HIGH (ref 65–99)
POTASSIUM: 4 mmol/L (ref 3.5–5.1)
Sodium: 138 mmol/L (ref 135–145)

## 2015-06-01 LAB — CBC WITH DIFFERENTIAL/PLATELET
BASOS ABS: 0 10*3/uL (ref 0.0–0.1)
Basophils Relative: 0 %
Eosinophils Absolute: 0.1 10*3/uL (ref 0.0–0.7)
Eosinophils Relative: 1 %
HCT: 49 % (ref 39.0–52.0)
HEMOGLOBIN: 16.2 g/dL (ref 13.0–17.0)
LYMPHS ABS: 2.5 10*3/uL (ref 0.7–4.0)
LYMPHS PCT: 41 %
MCH: 27.7 pg (ref 26.0–34.0)
MCHC: 33.1 g/dL (ref 30.0–36.0)
MCV: 83.8 fL (ref 78.0–100.0)
Monocytes Absolute: 0.4 10*3/uL (ref 0.1–1.0)
Monocytes Relative: 6 %
NEUTROS ABS: 3.2 10*3/uL (ref 1.7–7.7)
NEUTROS PCT: 52 %
Platelets: 204 10*3/uL (ref 150–400)
RBC: 5.85 MIL/uL — AB (ref 4.22–5.81)
RDW: 13.4 % (ref 11.5–15.5)
WBC: 6.1 10*3/uL (ref 4.0–10.5)

## 2015-06-01 MED ORDER — ONDANSETRON HCL 4 MG/2ML IJ SOLN
4.0000 mg | Freq: Once | INTRAMUSCULAR | Status: AC
  Administered 2015-06-01: 4 mg via INTRAVENOUS
  Filled 2015-06-01: qty 2

## 2015-06-01 MED ORDER — ONDANSETRON HCL 4 MG PO TABS: 4.0000 mg | ORAL_TABLET | Freq: Three times a day (TID) | ORAL | Status: DC | PRN

## 2015-06-01 MED ORDER — LOPERAMIDE HCL 2 MG PO CAPS: 2.0000 mg | ORAL_CAPSULE | Freq: Four times a day (QID) | ORAL | Status: DC | PRN

## 2015-06-01 MED ORDER — SODIUM CHLORIDE 0.9 % IV BOLUS (SEPSIS)
1000.0000 mL | Freq: Once | INTRAVENOUS | Status: AC
  Administered 2015-06-01: 1000 mL via INTRAVENOUS

## 2015-06-01 MED ORDER — LOPERAMIDE HCL 2 MG PO CAPS
4.0000 mg | ORAL_CAPSULE | Freq: Once | ORAL | Status: AC
  Administered 2015-06-01: 4 mg via ORAL
  Filled 2015-06-01: qty 2

## 2015-06-01 NOTE — ED Notes (Signed)
PA at bedside.

## 2015-06-01 NOTE — Discharge Instructions (Signed)
Read the information below.  Use the prescribed medication as directed.  Please discuss all new medications with your pharmacist.  You may return to the Emergency Department at any time for worsening condition or any new symptoms that concern you.    If you develop high fevers, worsening abdominal pain, uncontrolled vomiting, or are unable to tolerate fluids by mouth, return to the ER for a recheck.    Please recheck your blood pressure within the next week.      Hypertension Hypertension, commonly called high blood pressure, is when the force of blood pumping through your arteries is too strong. Your arteries are the blood vessels that carry blood from your heart throughout your body. A blood pressure reading consists of a higher number over a lower number, such as 110/72. The higher number (systolic) is the pressure inside your arteries when your heart pumps. The lower number (diastolic) is the pressure inside your arteries when your heart relaxes. Ideally you want your blood pressure below 120/80. Hypertension forces your heart to work harder to pump blood. Your arteries may become narrow or stiff. Having untreated or uncontrolled hypertension can cause heart attack, stroke, kidney disease, and other problems. RISK FACTORS Some risk factors for high blood pressure are controllable. Others are not.  Risk factors you cannot control include:   Race. You may be at higher risk if you are African American.  Age. Risk increases with age.  Gender. Men are at higher risk than women before age 59 years. After age 80, women are at higher risk than men. Risk factors you can control include:  Not getting enough exercise or physical activity.  Being overweight.  Getting too much fat, sugar, calories, or salt in your diet.  Drinking too much alcohol. SIGNS AND SYMPTOMS Hypertension does not usually cause signs or symptoms. Extremely high blood pressure (hypertensive crisis) may cause headache,  anxiety, shortness of breath, and nosebleed. DIAGNOSIS To check if you have hypertension, your health care provider will measure your blood pressure while you are seated, with your arm held at the level of your heart. It should be measured at least twice using the same arm. Certain conditions can cause a difference in blood pressure between your right and left arms. A blood pressure reading that is higher than normal on one occasion does not mean that you need treatment. If it is not clear whether you have high blood pressure, you may be asked to return on a different day to have your blood pressure checked again. Or, you may be asked to monitor your blood pressure at home for 1 or more weeks. TREATMENT Treating high blood pressure includes making lifestyle changes and possibly taking medicine. Living a healthy lifestyle can help lower high blood pressure. You may need to change some of your habits. Lifestyle changes may include:  Following the DASH diet. This diet is high in fruits, vegetables, and whole grains. It is low in salt, red meat, and added sugars.  Keep your sodium intake below 2,300 mg per day.  Getting at least 30-45 minutes of aerobic exercise at least 4 times per week.  Losing weight if necessary.  Not smoking.  Limiting alcoholic beverages.  Learning ways to reduce stress. Your health care provider may prescribe medicine if lifestyle changes are not enough to get your blood pressure under control, and if one of the following is true:  You are 40-86 years of age and your systolic blood pressure is above 140.  You are  54 years of age or older, and your systolic blood pressure is above 150.  Your diastolic blood pressure is above 90.  You have diabetes, and your systolic blood pressure is over 140 or your diastolic blood pressure is over 90.  You have kidney disease and your blood pressure is above 140/90.  You have heart disease and your blood pressure is above  140/90. Your personal target blood pressure may vary depending on your medical conditions, your age, and other factors. HOME CARE INSTRUCTIONS  Have your blood pressure rechecked as directed by your health care provider.   Take medicines only as directed by your health care provider. Follow the directions carefully. Blood pressure medicines must be taken as prescribed. The medicine does not work as well when you skip doses. Skipping doses also puts you at risk for problems.  Do not smoke.   Monitor your blood pressure at home as directed by your health care provider. SEEK MEDICAL CARE IF:   You think you are having a reaction to medicines taken.  You have recurrent headaches or feel dizzy.  You have swelling in your ankles.  You have trouble with your vision. SEEK IMMEDIATE MEDICAL CARE IF:  You develop a severe headache or confusion.  You have unusual weakness, numbness, or feel faint.  You have severe chest or abdominal pain.  You vomit repeatedly.  You have trouble breathing. MAKE SURE YOU:   Understand these instructions.  Will watch your condition.  Will get help right away if you are not doing well or get worse.   This information is not intended to replace advice given to you by your health care provider. Make sure you discuss any questions you have with your health care provider.   Document Released: 12/26/2004 Document Revised: 05/12/2014 Document Reviewed: 10/18/2012 Elsevier Interactive Patient Education 2016 [employer]   [employer] Resource Guide Financial Assistance The United Ways 211 is a great source of information about community services available.  Access by dialing 2-1-1 from anywhere in [place], or by website -  PooledIncome.plwww.nc211.org.   Other Local Resources (Updated 01/2015)  Financial Assistance   Services    Phone Number and Address  [facility]  Low-cost medical care - 1st and 3rd Saturday of every month  Must  not qualify for public or private insurance and must have limited income [phone] [address] [place], [place]    [place] [employer] of Social Services  Child care  Emergency assistance for housing and [employer]  Food stamps  Medicaid [id number] [address] [place], [place] [id number]   [facility]  Low-cost medical care for children, communicable diseases, sexually-transmitted diseases, immunizations, maternity care, womens health and family planning [phone] [address] [place], [place] [id number]  [facility]   Medication assistance for [facility] residents  Must meet income requirements [phone] [address] [place], [place].    [facility]  Child care  Emergency assistance for housing and [employer]ilities  Food stamps  Medicaid [phone] [address] [place], [place] [id number]  [facility]   Low-cost medical care,   Monday through Friday, 9 am to 6 pm.   Accepts Medicare/Medicaid, and self-pay [phone] [address] [place], [place] [address]  [facility]  Low-cost medical care - Monday through Friday, 8:30 am - 5:30 pm  Accepts Medicaid and self-pay [phone] [address] [place], [place]  [address]   [facility]  Primary medical care, including for those with sickle cell disease  Accepts Medicare, Medicaid, insurance and self-pay [phone] [address] [place], [place]  [facility]   Primary medical care  Accepts Medicare, [place], insurance and self-pay [phone] [address] [place], [place] [address]   [facility] Department of Social Services  Child care  Emergency assistance for housing and [employer]  Medicaid [phone] [address]  [place], [place] [address]  [facility] Department of Health and [employer]  Child care  Emergency assistance for housing and [employer]  Medicaid [phone] [address] [place], [place] [address]   [facility] Medication Assistance Program  Medication assistance for [facility] residents with no insurance only  Must have a primary care doctor [phone] [address] [place], [place]  [facility]   Primary medical care  [place], [place], insurance  [phone] [address] [place], [place]  [facility]   Medication assistance [id number]  [facility]   Primary medical care  Accepts Medicare, [place], insurance and self-pay [phone] [address] [place], [place] [address]  [facility]   Primary medical care  Accepts Medicare, [place], insurance and self-pay [phone] [address] [place], [place] [address]  [facility]  For [place] residents between the ages of 47 and 79 who do not have any form of health insurance, Medicare, [place], or [place] benefits.  Services are provided free of charge to uninsured patients who fall within federal poverty guidelines.    Hours: Tuesdays and Thursdays, 4:15 - 8 pm [phone] [address] [place], [place] [address]  [facility]     Primary medical care  Dental care  Nutritional counseling  Pharmacy  Accepts Medicaid, Medicare, most insurance.  Fees are adjusted based on ability to pay.   [phone] [facility] [address] [place], [place]  [phone] Ph[facility] [address] [place], [place]  [phone] [facility] [place], [place]  [phone] [facility], [address] [place], [place]  [phone] [facility] [address] [place], [place]  [facility]  Womens health and family planning [phone] [address] [place] [place], [place]  [facility] Department of Social Services  Child care  Emergency assistance for housing and [employer]  Medicaid [phone] [address] [place], [place] [address]   [facility]    Ages 53 and older  Hours: Mondays and Thursdays, 7:00 am - 9:00 am Patients are seen on a first come, first served basis. [phone], ext. [phone] [place], [place]  [facility] Division of Social Services  Child care  Emergency assistance for housing and [employer]  Medicaid [phone] [address] [place], [place] [address]  [facility]  Medication assistance  Rental assistance  Food pantry  Medication assistance  Housing assistance  Emergency food distribution  Utility assistance [phone] [address] [place], [place]  [phone]  [address] [place], [place] [address] Hours: Tuesdays and Thursdays from 9am - 12 noon by appointment only  [phone] [address] [place], [place] [address]  [facility] - Lanae Boast   Accepts private insurance, [place], and [place].  Payment is based on  a sliding scale for those without insurance.  Hours: Mondays, Tuesdays and Thursdays, 8:30 am - 5:30 pm.   [phone] [address] [place], [place]  [facility] - Family Medicine at [facility], [place], and [place].  Payment is based on a sliding scale for those without insurance. [phone] [address] [place], [place]  [facility] - Pediatrics at E. Scientist, research (physical sciences), [employer], and [place].  Payment is based on a sliding scale for those without insurance [phone] [address], [employer], [place]  [facility] - Pediatrics at [employer], [place], and [place].  Payment is based on a sliding scale for those without insurance. [phone] [address] [place], [place]  [facility] - Pediatrics at [facility], [place], and [place].  Payment is based on a sliding scale for those without insurance. [phone], [phone] [place], [place].    [facility]  Maternity care.  Accepts Medicaid and self-pay. [phone] [address] [place], [place]

## 2015-06-01 NOTE — ED Provider Notes (Signed)
CSN: [id number]     Arrival date & time 06/01/15  4098 History   First MD Initiated Contact with Patient 06/01/15 0759     Chief Complaint  Patient presents with  . Emesis  . Diarrhea     (Consider location/radiation/quality/duration/timing/severity/associated sxs/prior Treatment) The history is provided by the patient.     Patient presents with N/V/D x 4 days.  Is having more diarrhea than vomiting.  Neither is bloody.  Has been having subjective fevers at night. The 3 days prior to this patient was having URI symptoms, which have completely resolved.  Has had sick contacts at work.  Denies chest or abdominal pain, urinary symptoms.  Denies recent travel or suspicious foods.  No hx abdominal surgeries.    History reviewed. No pertinent past medical history. Past Surgical History  Procedure Laterality Date  . Knee arthrocentesis    . Back surgery     Family History  Problem Relation Age of Onset  . Hypertension Mother   . Alzheimer's disease Mother   . Diabetes Father    Social History  Substance Use Topics  . Smoking status: Current Some Day Smoker    Types: Cigars  . Smokeless tobacco: None  . Alcohol Use: No    Review of Systems  All other systems reviewed and are negative.     Allergies  Review of patient's allergies indicates no known allergies.  Home Medications   Prior to Admission medications   Medication Sig Start Date End Date Taking? Authorizing Provider  Diphenhydramine-PE-APAP (THERAFLU EXPRESSMAX) 12.5-5-325 MG/15ML LIQD Take 10 mLs by mouth 2 (two) times daily as needed (for cold).   Yes Historical Provider, MD  Phenyleph-CPM-DM-Aspirin (ALKA-SELTZER PLUS COLD & COUGH PO) Take 1-2 tablets by mouth 2 (two) times daily as needed (for cold).   Yes Historical Provider, MD  HYDROcodone-acetaminophen (NORCO/VICODIN) 5-325 MG tablet Take 1 tablet by mouth every 6 (six) hours as needed. Patient not taking: Reported on 06/01/2015 03/11/15   Shon Baton,  MD   BP 161/103 mmHg  Pulse 69  Temp(Src) 98.7 F (37.1 C) (Oral)  Resp 20  SpO2 97% Physical Exam  Constitutional: He appears well-developed and well-nourished. No distress.  HENT:  Head: Normocephalic and atraumatic.  Neck: Neck supple.  Cardiovascular: Normal rate and regular rhythm.   Pulmonary/Chest: Effort normal and breath sounds normal. No respiratory distress. He has no wheezes. He has no rales.  Abdominal: Soft. He exhibits no distension and no mass. There is no tenderness. There is no rebound and no guarding.  Musculoskeletal: He exhibits no edema.  Neurological: He is alert. He exhibits normal muscle tone.  Skin: He is not diaphoretic.  Nursing note and vitals reviewed.   ED Course  Procedures (including critical care time) Labs Review Labs Reviewed  BASIC METABOLIC PANEL - Abnormal; Notable for the following:    Glucose, Bld 115 (*)    All other components within normal limits  CBC WITH DIFFERENTIAL/PLATELET - Abnormal; Notable for the following:    RBC 5.85 (*)    All other components within normal limits    Imaging Review No results found. I have personally reviewed and evaluated these images and lab results as part of my medical decision-making.   EKG Interpretation None      MDM   Final diagnoses:  Nausea vomiting and diarrhea  Essential hypertension   Afebrile, nontoxic patient with N/V/D x 4 days without abdominal pain.  Abdominal exam benign. Labs unremarkable.  IVF, zofran, imodium  given.  D/C home with home care, return precautions.  Discussed result, findings, treatment, and follow up  with patient.  Pt given return precautions.  Pt verbalizes understanding and agrees with plan.         Trixie Dredgemily Aireana Ryland, PA-C 06/01/15 1453  Azalia BilisKevin Campos, MD 06/01/15 1600

## 2015-06-01 NOTE — ED Notes (Signed)
Pt states he has had nausea, vomiting and diarrhea since Saturday  Pt states everytime he eats or drinks anything it causes him to have vomiting and or diarrhea  Pt states he had cold sxs Wed-Fri last week and then on Saturday all this started  Pt denies abd pain at this time

## 2015-06-01 NOTE — ED Notes (Signed)
Patient given gingerale for PO challenge.

## 2015-06-02 ENCOUNTER — Encounter (HOSPITAL_COMMUNITY): Payer: Self-pay | Admitting: Emergency Medicine

## 2015-06-02 DIAGNOSIS — R197 Diarrhea, unspecified: Secondary | ICD-10-CM | POA: Insufficient documentation

## 2015-06-02 DIAGNOSIS — R509 Fever, unspecified: Secondary | ICD-10-CM | POA: Insufficient documentation

## 2015-06-02 DIAGNOSIS — I10 Essential (primary) hypertension: Secondary | ICD-10-CM | POA: Insufficient documentation

## 2015-06-02 DIAGNOSIS — R61 Generalized hyperhidrosis: Secondary | ICD-10-CM | POA: Insufficient documentation

## 2015-06-02 DIAGNOSIS — R112 Nausea with vomiting, unspecified: Secondary | ICD-10-CM | POA: Insufficient documentation

## 2015-06-02 LAB — COMPREHENSIVE METABOLIC PANEL WITH GFR
ALT: 16 U/L — ABNORMAL LOW (ref 17–63)
AST: 18 U/L (ref 15–41)
Albumin: 3.9 g/dL (ref 3.5–5.0)
Alkaline Phosphatase: 75 U/L (ref 38–126)
Anion gap: 6 (ref 5–15)
BUN: 10 mg/dL (ref 6–20)
CO2: 25 mmol/L (ref 22–32)
Calcium: 9.1 mg/dL (ref 8.9–10.3)
Chloride: 108 mmol/L (ref 101–111)
Creatinine, Ser: 1.03 mg/dL (ref 0.61–1.24)
GFR calc Af Amer: >60 mL/min
GFR calc non Af Amer: >60 mL/min
Glucose, Bld: 128 mg/dL — ABNORMAL HIGH (ref 65–99)
Potassium: 3.7 mmol/L (ref 3.5–5.1)
Sodium: 139 mmol/L (ref 135–145)
Total Bilirubin: 0.6 mg/dL (ref 0.3–1.2)
Total Protein: 7.3 g/dL (ref 6.5–8.1)

## 2015-06-02 LAB — URINALYSIS, ROUTINE W REFLEX MICROSCOPIC
Bilirubin Urine: NEGATIVE
Glucose, UA: NEGATIVE mg/dL
Ketones, ur: NEGATIVE mg/dL
Nitrite: NEGATIVE
Protein, ur: NEGATIVE mg/dL
Specific Gravity, Urine: 1.019 (ref 1.005–1.030)
pH: 5.5 (ref 5.0–8.0)

## 2015-06-02 LAB — CBC
HCT: 46.5 % (ref 39.0–52.0)
Hemoglobin: 14.7 g/dL (ref 13.0–17.0)
MCH: 27 pg (ref 26.0–34.0)
MCHC: 31.6 g/dL (ref 30.0–36.0)
MCV: 85.3 fL (ref 78.0–100.0)
PLATELETS: 192 10*3/uL (ref 150–400)
RBC: 5.45 MIL/uL (ref 4.22–5.81)
RDW: 13.2 % (ref 11.5–15.5)
WBC: 7.5 10*3/uL (ref 4.0–10.5)

## 2015-06-02 LAB — URINE MICROSCOPIC-ADD ON

## 2015-06-02 NOTE — ED Notes (Signed)
Pt. reports multiple diarrhea and emesis onset last week with generalized weakness/fatigue , denies fever or chills.

## 2015-06-03 ENCOUNTER — Emergency Department (HOSPITAL_COMMUNITY)
Admission: EM | Admit: 2015-06-03 | Discharge: 2015-06-03 | Disposition: A | Discharge status: Home / Self Care | Payer: Self-pay | Attending: Emergency Medicine | Admitting: Emergency Medicine

## 2015-06-03 DIAGNOSIS — R1110 Vomiting, unspecified: Secondary | ICD-10-CM

## 2015-06-03 DIAGNOSIS — R197 Diarrhea, unspecified: Secondary | ICD-10-CM

## 2015-06-03 HISTORY — DX: Essential (primary) hypertension: I10

## 2015-06-03 MED ORDER — ONDANSETRON 4 MG PO TBDP: ORAL_TABLET | ORAL | Status: DC

## 2015-06-03 MED ORDER — ONDANSETRON HCL 4 MG/2ML IJ SOLN
4.0000 mg | Freq: Once | INTRAMUSCULAR | Status: AC
  Administered 2015-06-03: 4 mg via INTRAVENOUS
  Filled 2015-06-03: qty 2

## 2015-06-03 MED ORDER — SODIUM CHLORIDE 0.9 % IV BOLUS (SEPSIS)
2000.0000 mL | Freq: Once | INTRAVENOUS | Status: AC
  Administered 2015-06-03: 2000 mL via INTRAVENOUS

## 2015-06-03 NOTE — ED Notes (Signed)
Pt has been given applesauce, sandwich, crackers, and drink. Reports continued hunger with minimal residual nausea.

## 2015-06-03 NOTE — ED Provider Notes (Signed)
CSN: [id number]     Arrival date & time 06/02/15  2225 History  By signing my name below, I, Marisue HumbleMichelle Chaffee, attest that this documentation has been prepared under the direction and in the presence of Loren Raceravid Meher Kucinski, MD . Electronically Signed: Marisue HumbleMichelle Chaffee, Scribe. 06/03/2015. 2:46 AM.   Chief Complaint  Patient presents with  . Emesis  . Diarrhea   The history is provided by the patient. No language interpreter was used.   HPI Comments:  Marcus Wall is a 55 y.o. male with PMHx of HTN who presents to the Emergency Department complaining of persistent episodes of non-bloody diarrhea onset 2 days ago. Pt reports associated non-bloody vomiting onset today, intermittent subjective fever at night, chills and diaphoresis. He states he has not been able to keep down PO fluids today. No alleviating factors noted or treatments attempted PTA. Pt reports eating [place] food 4 days ago that he thinks may be related current symptoms. He also notes recent cold symptoms in the past week. Pt reports sick contacts at work with colds. Denies abdominal pain or recent travel out the county, .   Past Medical History  Diagnosis Date  . Hypertension    Past Surgical History  Procedure Laterality Date  . Knee arthrocentesis    . Back surgery     Family History  Problem Relation Age of Onset  . Hypertension Mother   . Alzheimer's disease Mother   . Diabetes Father    Social History  Substance Use Topics  . Smoking status: Current Some Day Smoker    Types: Cigars  . Smokeless tobacco: None  . Alcohol Use: No    Review of Systems  Constitutional: Positive for fever (subjective), chills and diaphoresis.  Respiratory: Negative for cough and shortness of breath.   Cardiovascular: Negative for chest pain.  Gastrointestinal: Positive for nausea, vomiting and diarrhea. Negative for abdominal pain and blood in stool.  Musculoskeletal: Negative for myalgias, back pain, neck pain and neck  stiffness.  Skin: Negative for rash and wound.  Neurological: Negative for dizziness, weakness, light-headedness, numbness and headaches.  All other systems reviewed and are negative.   Allergies  Review of patient's allergies indicates no known allergies.  Home Medications   Prior to Admission medications   Medication Sig Start Date End Date Taking? Authorizing Provider  ondansetron (ZOFRAN ODT) 4 MG disintegrating tablet 4mg  ODT q4 hours prn nausea/vomit 06/03/15   Loren Raceravid Jaimey Franchini, MD   BP 176/131 mmHg  Pulse 68  Temp(Src) 97.9 F (36.6 C) (Oral)  Resp 18  Ht 6\' 8"  (2.032 m)  Wt 280 lb (127.007 kg)  BMI 30.76 kg/m2  SpO2 100% Physical Exam  Constitutional: He is oriented to person, place, and time. He appears well-developed and well-nourished. No distress.  HENT:  Head: Normocephalic and atraumatic.  Mouth/Throat: Oropharynx is clear and moist. No oropharyngeal exudate.  Eyes: EOM are normal. Pupils are equal, round, and reactive to light.  Neck: Normal range of motion. Neck supple.  Cardiovascular: Normal rate and regular rhythm.   Pulmonary/Chest: Effort normal and breath sounds normal. No respiratory distress. He has no wheezes. He has no rales. He exhibits no tenderness.  Abdominal: Soft. Bowel sounds are normal. He exhibits no distension and no mass. There is no tenderness. There is no rebound and no guarding.  Musculoskeletal: Normal range of motion. He exhibits no edema or tenderness.  No lower extremity swelling or asymmetry.  Neurological: He is alert and oriented to person, place, and time.  Moves all extremities without deficit. Sensation is fully intact.  Skin: Skin is warm and dry. No rash noted. No erythema.  Psychiatric: He has a normal mood and affect. His behavior is normal.  Nursing note and vitals reviewed.   ED Course  Procedures  DIAGNOSTIC STUDIES:  Oxygen Saturation is 100% on RA, normal by my interpretation.    COORDINATION OF CARE:  1:37 AM  Will administer fluids and Zofran. Discussed treatment plan with pt at bedside and pt agreed to plan.  Labs Review Labs Reviewed  COMPREHENSIVE METABOLIC PANEL - Abnormal; Notable for the following:    Glucose, Bld 128 (*)    ALT 16 (*)    All other components within normal limits  URINALYSIS, ROUTINE W REFLEX MICROSCOPIC (NOT AT [facility]) - Abnormal; Notable for the following:    APPearance CLOUDY (*)    Hgb urine dipstick SMALL (*)    Leukocytes, UA SMALL (*)    All other components within normal limits  URINE MICROSCOPIC-ADD ON - Abnormal; Notable for the following:    Squamous Epithelial / LPF 0-5 (*)    Bacteria, UA RARE (*)    All other components within normal limits  CBC    Imaging Review No results found. I have personally reviewed and evaluated these images and lab results as part of my medical decision-making.   EKG Interpretation None      MDM   Final diagnoses:  Vomiting and diarrhea   I personally performed the services described in this documentation, which was scribed in my presence. The recorded information has been reviewed and is accurate.   No vomiting in the emergency department. Tolerating oral challenge. We'll discharge home with Zofran and work note. Suspect gastroenteritis versus food born illness. Return precautions given.   Loren Racer, MD 06/03/15 [phone]

## 2015-06-03 NOTE — Discharge Instructions (Signed)

## 2015-06-03 NOTE — ED Notes (Signed)
Pt departed in NAD.  

## 2016-04-26 ENCOUNTER — Emergency Department (HOSPITAL_COMMUNITY)
Admission: EM | Admit: 2016-04-26 | Discharge: 2016-04-26 | Disposition: A | Discharge status: Home / Self Care | Payer: Self-pay | Attending: Emergency Medicine | Admitting: Emergency Medicine

## 2016-04-26 ENCOUNTER — Encounter (HOSPITAL_COMMUNITY): Payer: Self-pay | Admitting: Emergency Medicine

## 2016-04-26 DIAGNOSIS — R82998 Other abnormal findings in urine: Secondary | ICD-10-CM

## 2016-04-26 DIAGNOSIS — F17290 Nicotine dependence, other tobacco product, uncomplicated: Secondary | ICD-10-CM | POA: Insufficient documentation

## 2016-04-26 DIAGNOSIS — R1013 Epigastric pain: Secondary | ICD-10-CM

## 2016-04-26 DIAGNOSIS — Z202 Contact with and (suspected) exposure to infections with a predominantly sexual mode of transmission: Secondary | ICD-10-CM

## 2016-04-26 DIAGNOSIS — I10 Essential (primary) hypertension: Secondary | ICD-10-CM | POA: Insufficient documentation

## 2016-04-26 DIAGNOSIS — R8299 Other abnormal findings in urine: Secondary | ICD-10-CM | POA: Insufficient documentation

## 2016-04-26 DIAGNOSIS — K279 Peptic ulcer, site unspecified, unspecified as acute or chronic, without hemorrhage or perforation: Secondary | ICD-10-CM

## 2016-04-26 LAB — COMPREHENSIVE METABOLIC PANEL
ALT: 14 U/L — AB (ref 17–63)
AST: 16 U/L (ref 15–41)
Albumin: 3.8 g/dL (ref 3.5–5.0)
Alkaline Phosphatase: 67 U/L (ref 38–126)
Anion gap: 7 (ref 5–15)
BUN: 14 mg/dL (ref 6–20)
CHLORIDE: 106 mmol/L (ref 101–111)
CO2: 25 mmol/L (ref 22–32)
CREATININE: 1.11 mg/dL (ref 0.61–1.24)
Calcium: 9 mg/dL (ref 8.9–10.3)
Glucose, Bld: 109 mg/dL — ABNORMAL HIGH (ref 65–99)
POTASSIUM: 4.2 mmol/L (ref 3.5–5.1)
Sodium: 138 mmol/L (ref 135–145)
Total Bilirubin: 0.1 mg/dL — ABNORMAL LOW (ref 0.3–1.2)
Total Protein: 6.8 g/dL (ref 6.5–8.1)

## 2016-04-26 LAB — CBC
HCT: 41.5 % (ref 39.0–52.0)
Hemoglobin: 13.7 g/dL (ref 13.0–17.0)
MCH: 27.8 pg (ref 26.0–34.0)
MCHC: 33 g/dL (ref 30.0–36.0)
MCV: 84.2 fL (ref 78.0–100.0)
PLATELETS: 196 10*3/uL (ref 150–400)
RBC: 4.93 MIL/uL (ref 4.22–5.81)
RDW: 13.5 % (ref 11.5–15.5)
WBC: 8.2 10*3/uL (ref 4.0–10.5)

## 2016-04-26 LAB — URINALYSIS, ROUTINE W REFLEX MICROSCOPIC
BILIRUBIN URINE: NEGATIVE
GLUCOSE, UA: NEGATIVE mg/dL
KETONES UR: NEGATIVE mg/dL
NITRITE: NEGATIVE
PROTEIN: NEGATIVE mg/dL
Specific Gravity, Urine: 1.025 (ref 1.005–1.030)
pH: 5 (ref 5.0–8.0)

## 2016-04-26 LAB — LIPASE, BLOOD: LIPASE: 29 U/L (ref 11–51)

## 2016-04-26 LAB — HIV ANTIBODY (ROUTINE TESTING W REFLEX): HIV Screen 4th Generation wRfx: NONREACTIVE

## 2016-04-26 LAB — GC/CHLAMYDIA PROBE AMP (~~LOC~~) NOT AT ARMC
Chlamydia: POSITIVE — AB
Neisseria Gonorrhea: NEGATIVE

## 2016-04-26 MED ORDER — OMEPRAZOLE 20 MG PO CPDR: 20.0000 mg | DELAYED_RELEASE_CAPSULE | Freq: Every day | ORAL | 0 refills | Status: DC

## 2016-04-26 MED ORDER — METRONIDAZOLE 500 MG PO TABS: 500.0000 mg | ORAL_TABLET | Freq: Two times a day (BID) | ORAL | 0 refills | Status: DC

## 2016-04-26 MED ORDER — LIDOCAINE HCL (PF) 1 % IJ SOLN
INTRAMUSCULAR | Status: AC
  Administered 2016-04-26: 1 mL
  Filled 2016-04-26: qty 5

## 2016-04-26 MED ORDER — CEFTRIAXONE SODIUM 250 MG IJ SOLR
250.0000 mg | Freq: Once | INTRAMUSCULAR | Status: AC
  Administered 2016-04-26: 250 mg via INTRAMUSCULAR
  Filled 2016-04-26: qty 250

## 2016-04-26 MED ORDER — AZITHROMYCIN 250 MG PO TABS
1000.0000 mg | ORAL_TABLET | Freq: Once | ORAL | Status: AC
  Administered 2016-04-26: 1000 mg via ORAL
  Filled 2016-04-26: qty 4

## 2016-04-26 NOTE — ED Triage Notes (Signed)
Patient reports mid abdominal cramping with emesis and diarrhea onset this evening , denies fever or chills .

## 2016-04-26 NOTE — ED Notes (Signed)
Pt given ice chips

## 2016-04-26 NOTE — Discharge Instructions (Signed)
Please take the meds prescribed. We are suspicious about you having stomach ulcer.  Please return to the ER if your symptoms worsen; you have increased pain, fevers, chills, inability to keep any medications down, confusion. Otherwise see the outpatient stomach doctor as requested.

## 2016-04-26 NOTE — ED Provider Notes (Signed)
[facility]-EMERGENCY DEPT Provider Note   CSN: [id number] Arrival date & time: 04/26/16  0121  By signing my name below, I, Marcus Wall, attest that this documentation has been prepared under the direction and in the presence of Derwood Kaplan, MD.  Electronically Signed: Octavia Wall, ED Scribe. 04/26/16. 4:11 AM.    History   Chief Complaint Chief Complaint  Patient presents with  . Abdominal Pain  . Emesis     The history is provided by the patient. No language interpreter was used.     HPI Comments: Marcus Wall is a 56 y.o. male who presents to the Emergency Department complaining of acute onset, moderate, epigastric abdominal pain that began ~ 7 hours ago. Pt reports associated nausea and vomiting x 5. Pt reports eating [employer] chicken prior to going to work ~ 8:30 this evening. He states ~ 30 minutes later, he began to have epigastric abdominal pain that radiated into his upper back. He denies substance abuse or ETOH use. He does not have a hx of abdominal ulcers or a family hx of MI at a young age. Pt is a smoker.   Upon resulted labs, it was found that pt had an increased white blood cell count. He reports not having a hx of UTI's in the past. Pt says that he has a hx of kidney stones but reports it has been awhile and does not remember what the pain feels like. Upon further questioning, pt says that he has had unprotected sex several months ago, but has not had multiple partners within the past year. Pt denies bloody stools, dysuria, hematuria, and urinary frequency.   Past Medical History:  Diagnosis Date  . Hypertension     There are no active problems to display for this patient.   Past Surgical History:  Procedure Laterality Date  . BACK SURGERY    . KNEE ARTHROCENTESIS         Home Medications    Prior to Admission medications   Medication Sig Start Date End Date Taking? Authorizing Provider  ondansetron (ZOFRAN ODT) 4 MG disintegrating  tablet  ODT q4 hours prn nausea/vomit 06/03/15  Yes Loren Racer, MD  metroNIDAZOLE (FLAGYL) 500 MG tablet Take 1 tablet (500 mg total) by mouth 2 (two) times daily. 04/26/16   Derwood Kaplan, MD  omeprazole (PRILOSEC) 20 MG capsule Take 1 capsule (20 mg total) by mouth daily. 04/26/16   Derwood Kaplan, MD    Family History Family History  Problem Relation Age of Onset  . Hypertension Mother   . Alzheimer's disease Mother   . Diabetes Father     Social History Social History  Substance Use Topics  . Smoking status: Current Some Day Smoker    Types: Cigars  . Smokeless tobacco: Never Used  . Alcohol use No     Allergies   Patient has no known allergies.   Review of Systems Review of Systems  All other systems reviewed and are negative for acute changes except as noted in the HPI.  Physical Exam Updated Vital Signs BP (!) 159/91   Pulse (!) 51   Temp 98.1 F (36.7 C) (Oral)   Resp 17   Ht  (2.032 m)   Wt 255 lb (115.7 kg)   SpO2 99%   BMI 28.01 kg/m   Physical Exam  Constitutional: He is oriented to person, place, and time. He appears well-developed and well-nourished.  HENT:  Head: Normocephalic and atraumatic.  Eyes: EOM are normal.  Neck: Normal range of motion.  Cardiovascular: Normal rate, regular rhythm, normal heart sounds and intact distal pulses.   No murmur heard. Pulmonary/Chest: Effort normal and breath sounds normal. No respiratory distress.  Lungs clear to auscultation bilaterally  Abdominal: Soft. He exhibits no distension. There is tenderness. There is no rebound and no guarding.  Tenderness to multiple locations of the abdomen that is worse in the epigastric region, positive bowel sounds   Musculoskeletal: Normal range of motion.  Upper back pain   Neurological: He is alert and oriented to person, place, and time.  Skin: Skin is warm and dry.  Psychiatric: He has a normal mood and affect. Judgment normal.  Nursing note and vitals  reviewed.    ED Treatments / Results  Labs (all labs ordered are listed, but only abnormal results are displayed)  DIAGNOSTIC STUDIES: Oxygen Saturation is 97% on RA, normal by my interpretation.  COORDINATION OF CARE: 4:01 AM Discussed treatment plan with pt at bedside and pt agreed to plan.  Labs Reviewed  COMPREHENSIVE METABOLIC PANEL - Abnormal; Notable for the following:       Result Value   Glucose, Bld 109 (*)    ALT 14 (*)    Total Bilirubin 0.1 (*)    All other components within normal limits  URINALYSIS, ROUTINE W REFLEX MICROSCOPIC - Abnormal; Notable for the following:    APPearance CLOUDY (*)    Hgb urine dipstick SMALL (*)    Leukocytes, UA LARGE (*)    Bacteria, UA RARE (*)    Squamous Epithelial / LPF 0-5 (*)    All other components within normal limits  LIPASE, BLOOD  CBC  HIV ANTIBODY (ROUTINE TESTING)  GC/CHLAMYDIA PROBE AMP ([place]) NOT AT [facility]    EKG  EKG Interpretation None       Radiology No results found.  Procedures Procedures (including critical care time)  Medications Ordered in ED Medications  cefTRIAXone (ROCEPHIN) injection 250 mg (250 mg Intramuscular Given 04/26/16 0354)  azithromycin (ZITHROMAX) tablet 1,000 mg (1,000 mg Oral Given 04/26/16 0355)  lidocaine (PF) (XYLOCAINE) 1 % injection (1 mL  Given 04/26/16 0356)     Initial Impression / Assessment and Plan / ED Course  I have reviewed the triage vital signs and the nursing notes.  Pertinent labs & imaging results that were available during my care of the patient were reviewed by me and considered in my medical decision making (see chart for details).     Pt comes in with cc of abd pain. Pt had fast food, and within minutes he started having epigastric abd pain and nausea and emesis. Now he is feeling better - he still has nausea. On exam, he had moderate epigastric tenderness, al the other area have mild to no discomfort. Pt is healthy male. No drug use, heavy  drinking and has no hx of similar complains in the past.  Pt's UA has pyuria. Pt denies any dysuria, hematuria, polyuria. That led [place] to asking patient about his sexual hx - and it seems like pt was having unprotected intercourse with someone 2 months ago and that person might have STD. He has agreed to get meds for [facility] and [facility], and we will send him home with flagyl. Pt consented to HIV screen as well.  I suspect that the epigastric pain is due to PUD. I doubt he has cholecystitis. I dont think he needs CT scan.  Final Clinical Impressions(s) / ED Diagnoses   Final diagnoses:  Urine leukocytes  Possible exposure to STD  Epigastric abdominal pain  PUD (peptic ulcer disease)    New Prescriptions New Prescriptions   METRONIDAZOLE (FLAGYL) 500 MG TABLET    Take 1 tablet (500 mg total) by mouth 2 (two) times daily.   OMEPRAZOLE (PRILOSEC) 20 MG CAPSULE    Take 1 capsule (20 mg total) by mouth daily.   I personally performed the services described in this documentation, which was scribed in my presence. The recorded information has been reviewed and is accurate.    Derwood Kaplan, MD 04/26/16 [phone]

## 2017-05-02 ENCOUNTER — Emergency Department (HOSPITAL_COMMUNITY): Payer: Self-pay

## 2017-05-02 ENCOUNTER — Emergency Department (HOSPITAL_COMMUNITY)
Admission: EM | Admit: 2017-05-02 | Discharge: 2017-05-03 | Disposition: A | Discharge status: Home / Self Care | Payer: Self-pay | Attending: Emergency Medicine | Admitting: Emergency Medicine

## 2017-05-02 ENCOUNTER — Encounter (HOSPITAL_COMMUNITY): Payer: Self-pay | Admitting: Emergency Medicine

## 2017-05-02 DIAGNOSIS — R079 Chest pain, unspecified: Secondary | ICD-10-CM | POA: Insufficient documentation

## 2017-05-02 DIAGNOSIS — Z5321 Procedure and treatment not carried out due to patient leaving prior to being seen by health care provider: Secondary | ICD-10-CM | POA: Insufficient documentation

## 2017-05-02 DIAGNOSIS — R0789 Other chest pain: Secondary | ICD-10-CM

## 2017-05-02 LAB — CBC
HCT: 42.2 % (ref 39.0–52.0)
Hemoglobin: 13.8 g/dL (ref 13.0–17.0)
MCH: 27.8 pg (ref 26.0–34.0)
MCHC: 32.7 g/dL (ref 30.0–36.0)
MCV: 84.9 fL (ref 78.0–100.0)
Platelets: 186 10*3/uL (ref 150–400)
RBC: 4.97 MIL/uL (ref 4.22–5.81)
RDW: 13.9 % (ref 11.5–15.5)
WBC: 7.1 10*3/uL (ref 4.0–10.5)

## 2017-05-02 LAB — I-STAT TROPONIN, ED
Troponin i, poc: 0 ng/mL (ref 0.00–0.08)
Troponin i, poc: 0 ng/mL (ref 0.00–0.08)

## 2017-05-02 LAB — BASIC METABOLIC PANEL
Anion gap: 7 (ref 5–15)
BUN: 11 mg/dL (ref 6–20)
CALCIUM: 9.1 mg/dL (ref 8.9–10.3)
CHLORIDE: 107 mmol/L (ref 101–111)
CO2: 25 mmol/L (ref 22–32)
CREATININE: 1.09 mg/dL (ref 0.61–1.24)
GFR calc non Af Amer: >60 mL/min (ref >60)
Glucose, Bld: 96 mg/dL (ref 65–99)
Potassium: 4 mmol/L (ref 3.5–5.1)
SODIUM: 139 mmol/L (ref 135–145)

## 2017-05-02 NOTE — ED Triage Notes (Signed)
Patient presents to ED for assessment of left sided chest pain starting today while at work.  C/o SOB, c/o 1 episode of nausea.  Decreased lung sounds on left.  Radiation to back and left arm.

## 2017-05-02 NOTE — ED Provider Notes (Cosign Needed)
Patient placed in Quick Look pathway, seen and evaluated   Chief Complaint: Chest pain  HPI:   Patient is a 57 year old male who presents with chest pain that began at 3 AM while patient was at work.  Patient reports his pain as sharp and constant.  It radiates to his back and left arm.  His pain is worse with palpation, movement, and breathing.  It is better when he leans forward.  He has had associated shortness of breath and one episode of nausea.  He did not take any medications prior to arrival.  He is previously healthy, however he was told he had elevated blood pressure last week when he tried to give plasma.  He is not taking any medications for it.  He denies any recent long trips, surgeries, known cancer, new leg pain or swelling.  He smokes 2 cigarettes daily.  ROS: Positive for chest pain, shortness of breath, nausea.  Negative for abdominal pain, vomiting, leg swelling.  Physical Exam:   Gen: No distress  Neuro: Awake and Alert  Skin: Warm    Focused Exam: Heart normal rate, rhythm, lung sounds decreased on the left, otherwise clear to auscultation, abdomen soft, nontender; tenderness over the left chest and left rib cage anteriorly and posteriorly.  Patient with somewhat marfanoid appearance.  Chest pain labs ordered from triage, will defer to next provider potential d-dimer/PE workup after chest x-ray to rule out pneumothorax considering decreased breath sounds. May also be musculoskeletal, as reproducible and worse with movement.  Initiation of care has begun. The patient has been counseled on the process, plan, and necessity for staying for the completion/evaluation, and the remainder of the medical screening examination    Emi HolesLaw, Lanis Storlie M, PA-C 05/02/17 1741

## 2017-05-02 NOTE — ED Notes (Signed)
Labs reviewed.  No changes in acuity

## 2017-05-02 NOTE — ED Notes (Signed)
Patient continues to express frustrations with delays.  Patient stated to this RN "you guys better do something quick or else".  This RN attempted explain delays, and patient did not want to speak to this RN.  Tramaine, patient advocate, spoke to patient.  Patient recently had troponin repeated.  Tramaine asking to offer patient additional EKG.  Will offer

## 2017-05-02 NOTE — ED Notes (Signed)
Spoke to patient.  Made aware by Carmie Kannerallie, RN that patient requesting update.  This RN spoke to patient, made him aware of delays and "reassurance" by patient's results.  Patient states "someone is fixing to die out here like this".  This RN apologized, and made patient aware of wait time

## 2017-05-02 NOTE — ED Notes (Signed)
EDP at bedside  

## 2017-05-03 NOTE — ED Notes (Addendum)
Pt up to nurse first desk, verbally aggressive with staff. "you're just gonna let me die out here?" Pt states that "nobody is doing nothing, no one is going to the back." Assured patient that ED staff would not allow him to die in the waiting room, assured patient that VS look good. Informed patient that patients have been moving back to treatment rooms. Thanks patient for waiting, apologized for delays.

## 2017-05-03 NOTE — ED Notes (Signed)
Pt went outside into parking lot.

## 2017-11-30 ENCOUNTER — Encounter (HOSPITAL_COMMUNITY): Payer: Self-pay | Admitting: Emergency Medicine

## 2017-11-30 ENCOUNTER — Emergency Department (HOSPITAL_COMMUNITY)
Admission: EM | Admit: 2017-11-30 | Discharge: 2017-11-30 | Disposition: A | Discharge status: Home / Self Care | Payer: No Typology Code available for payment source | Attending: Emergency Medicine | Admitting: Emergency Medicine

## 2017-11-30 ENCOUNTER — Emergency Department (HOSPITAL_COMMUNITY): Payer: No Typology Code available for payment source

## 2017-11-30 DIAGNOSIS — M545 Low back pain, unspecified: Secondary | ICD-10-CM

## 2017-11-30 DIAGNOSIS — F17290 Nicotine dependence, other tobacco product, uncomplicated: Secondary | ICD-10-CM | POA: Insufficient documentation

## 2017-11-30 DIAGNOSIS — M546 Pain in thoracic spine: Secondary | ICD-10-CM | POA: Insufficient documentation

## 2017-11-30 DIAGNOSIS — R030 Elevated blood-pressure reading, without diagnosis of hypertension: Secondary | ICD-10-CM | POA: Diagnosis not present

## 2017-11-30 DIAGNOSIS — I10 Essential (primary) hypertension: Secondary | ICD-10-CM

## 2017-11-30 MED ORDER — METHOCARBAMOL 500 MG PO TABS: 500.0000 mg | ORAL_TABLET | Freq: Two times a day (BID) | ORAL | 0 refills | Status: DC

## 2017-11-30 MED ORDER — ACETAMINOPHEN 500 MG PO TABS
1000.0000 mg | ORAL_TABLET | Freq: Once | ORAL | Status: AC
  Administered 2017-11-30: 1000 mg via ORAL
  Filled 2017-11-30: qty 2

## 2017-11-30 MED ORDER — METHOCARBAMOL 500 MG PO TABS
750.0000 mg | ORAL_TABLET | Freq: Once | ORAL | Status: AC
Start: 2017-11-30 — End: 2017-11-30
  Administered 2017-11-30: 750 mg via ORAL
  Filled 2017-11-30: qty 2

## 2017-11-30 NOTE — ED Notes (Signed)
Pt offered hallway bed to be seen now. Pt refused, states that he wants to wait for a private room not in a hallway.
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
## 2020-01-14 ENCOUNTER — Encounter (HOSPITAL_COMMUNITY): Payer: Self-pay | Admitting: *Deleted

## 2020-01-14 ENCOUNTER — Emergency Department (HOSPITAL_COMMUNITY)
Admission: EM | Admit: 2020-01-14 | Discharge: 2020-01-14 | Disposition: A | Discharge status: Home / Self Care | Payer: Self-pay | Attending: Emergency Medicine | Admitting: Emergency Medicine

## 2020-01-14 ENCOUNTER — Other Ambulatory Visit: Payer: Self-pay

## 2020-01-14 DIAGNOSIS — I10 Essential (primary) hypertension: Secondary | ICD-10-CM | POA: Insufficient documentation

## 2020-01-14 DIAGNOSIS — U071 COVID-19: Secondary | ICD-10-CM | POA: Insufficient documentation

## 2020-01-14 DIAGNOSIS — F17290 Nicotine dependence, other tobacco product, uncomplicated: Secondary | ICD-10-CM | POA: Insufficient documentation

## 2020-01-14 DIAGNOSIS — Z021 Encounter for pre-employment examination: Secondary | ICD-10-CM | POA: Insufficient documentation

## 2020-01-14 DIAGNOSIS — Z0289 Encounter for other administrative examinations: Secondary | ICD-10-CM

## 2020-01-14 NOTE — ED Notes (Addendum)
Pt asked about wait time and staff notified this pt of times and how process of rooming patients works. Pt continually talks over staff and will not listen. Notified pt to keep mask on

## 2020-01-14 NOTE — ED Notes (Signed)
Pt don't want vitals taken

## 2020-01-14 NOTE — ED Provider Notes (Signed)
[facility] EMERGENCY DEPARTMENT Provider Note   CSN: [id number] Arrival date & time: 01/14/20  0148     History Chief Complaint  Patient presents with  . Letter for School/Work    Marcus Wall is a 60 y.o. male.  HPI 60 year old male with hypertension presents to the ER requesting work note extension.  Patient tested positive for Covid a week ago, states he continues to have symptoms including headache, nausea, diarrhea and overall weakness.  States his work is stating that he needs a work note to extend his time off from work.  He is not vaccinated.  Denies any chest pain or shortness of breath.    Past Medical History:  Diagnosis Date  . Hypertension     There are no problems to display for this patient.   Past Surgical History:  Procedure Laterality Date  . BACK SURGERY    . KNEE ARTHROCENTESIS         Family History  Problem Relation Age of Onset  . Hypertension Mother   . Alzheimer's disease Mother   . Diabetes Father     Social History   Tobacco Use  . Smoking status: Current Some Day Smoker    Types: Cigars  . Smokeless tobacco: Never Used  Vaping Use  . Vaping Use: Never used  Substance Use Topics  . Alcohol use: No  . Drug use: No    Home Medications Prior to Admission medications   Medication Sig Start Date End Date Taking? Authorizing Provider  methocarbamol (ROBAXIN) 500 MG tablet Take 1 tablet (500 mg total) by mouth 2 (two) times daily. 11/30/17   Aviva Kluver B, PA-C  metroNIDAZOLE (FLAGYL) 500 MG tablet Take 1 tablet (500 mg total) by mouth 2 (two) times daily. Patient not taking: Reported on 11/30/2017 04/26/16   Derwood Kaplan, MD  omeprazole (PRILOSEC) 20 MG capsule Take 1 capsule (20 mg total) by mouth daily. Patient not taking: Reported on 11/30/2017 04/26/16   Derwood Kaplan, MD  ondansetron (ZOFRAN ODT) 4 MG disintegrating tablet 4mg  ODT q4 hours prn nausea/vomit Patient not taking: Reported on  11/30/2017 06/03/15   06/05/15, MD    Allergies    Patient has no known allergies.  Review of Systems   Review of Systems  Constitutional: Positive for activity change, appetite change, chills, fatigue and fever.  Respiratory: Positive for cough. Negative for shortness of breath.   Cardiovascular: Negative for chest pain.  Gastrointestinal: Positive for diarrhea and nausea. Negative for abdominal pain and vomiting.  Genitourinary: Negative for dysuria.  Neurological: Positive for weakness and headaches.    Physical Exam Updated Vital Signs BP (!) 187/100 (BP Location: Left Arm)   Pulse 64   Temp 98.7 F (37.1 C) (Oral)   Resp 18   SpO2 100%   Physical Exam Vitals and nursing note reviewed.  Constitutional:      General: He is not in acute distress.    Appearance: He is well-developed and well-nourished. He is not toxic-appearing or diaphoretic.     Comments: Very well-appearing  HENT:     Head: Normocephalic and atraumatic.  Eyes:     Conjunctiva/sclera: Conjunctivae normal.  Cardiovascular:     Rate and Rhythm: Normal rate and regular rhythm.     Pulses: Normal pulses.     Heart sounds: Normal heart sounds. No murmur heard.   Pulmonary:     Effort: Pulmonary effort is normal. No respiratory distress.     Breath sounds: Normal  breath sounds.     Comments: Speaking in full sentences without increased work of breathing Abdominal:     General: Abdomen is flat.     Palpations: Abdomen is soft.     Tenderness: There is no abdominal tenderness.  Musculoskeletal:        General: No edema. Normal range of motion.     Cervical back: Neck supple.  Skin:    General: Skin is warm and dry.  Neurological:     General: No focal deficit present.     Mental Status: He is alert and oriented to person, place, and time.  Psychiatric:        Mood and Affect: Mood and affect normal.     ED Results / Procedures / Treatments   Labs (all labs ordered are listed, but only  abnormal results are displayed) Labs Reviewed - No data to display  EKG None  Radiology No results found.  Procedures Procedures (including critical care time)  Medications Ordered in ED Medications - No data to display  ED Course  I have reviewed the triage vital signs and the nursing notes.  Pertinent labs & imaging results that were available during my care of the patient were reviewed by me and considered in my medical decision making (see chart for details).    MDM Rules/Calculators/A&P                          60 year old male who tested positive for Covid approximately week ago, requesting work note extension.  He is approximately 8 days into his symptoms, still symptomatic.  Will provide work note for several days.  Vitals overall reassuring, no evidence of hypoxia.  Physical exam also reassuring.  Return precautions discussed.  He voiced understanding and is agreeable.  Stable for discharge. Final Clinical Impression(s) / ED Diagnoses Final diagnoses:  Encounter to obtain excuse from work    Rx / DC Orders ED Discharge Orders    None       Leone Brand 01/14/20 7425    Jacalyn Lefevre, MD 01/14/20 1038

## 2020-01-14 NOTE — ED Notes (Signed)
Asked pt to keep mask on and to stay in the covid box in lobby so that we can prevent the spread of covid, pt does not want to keep mask on but is staying in box now

## 2020-01-14 NOTE — ED Notes (Signed)
Called pt for vitals no answer 

## 2020-01-14 NOTE — ED Notes (Signed)
Pt came up to me stating that he don't need his vitals taken. He just need a doctors note. I informed pt that he will see a provider. Pt stated when then stated that he has been here since 2am and the hospital shouldn't be moving this slow and that the hospital needs to be shut down. Pt then walked of before I said anything in return. Pt is now back in covid box.

## 2020-01-14 NOTE — ED Triage Notes (Signed)
Pt says that he was dx with COVID, suppose to return back to work but he is not feeling better and needs a work note extension.

## 2020-01-24 IMAGING — DX DG CHEST 2V
2 series · 2 of 2 positions shown · non-contrast
Comparison: 03/09/2010 CT and CXR

CLINICAL DATA: Chest pain at 3 a.m. this morning while at work

EXAM:
CHEST - 2 VIEW

[chest pa]
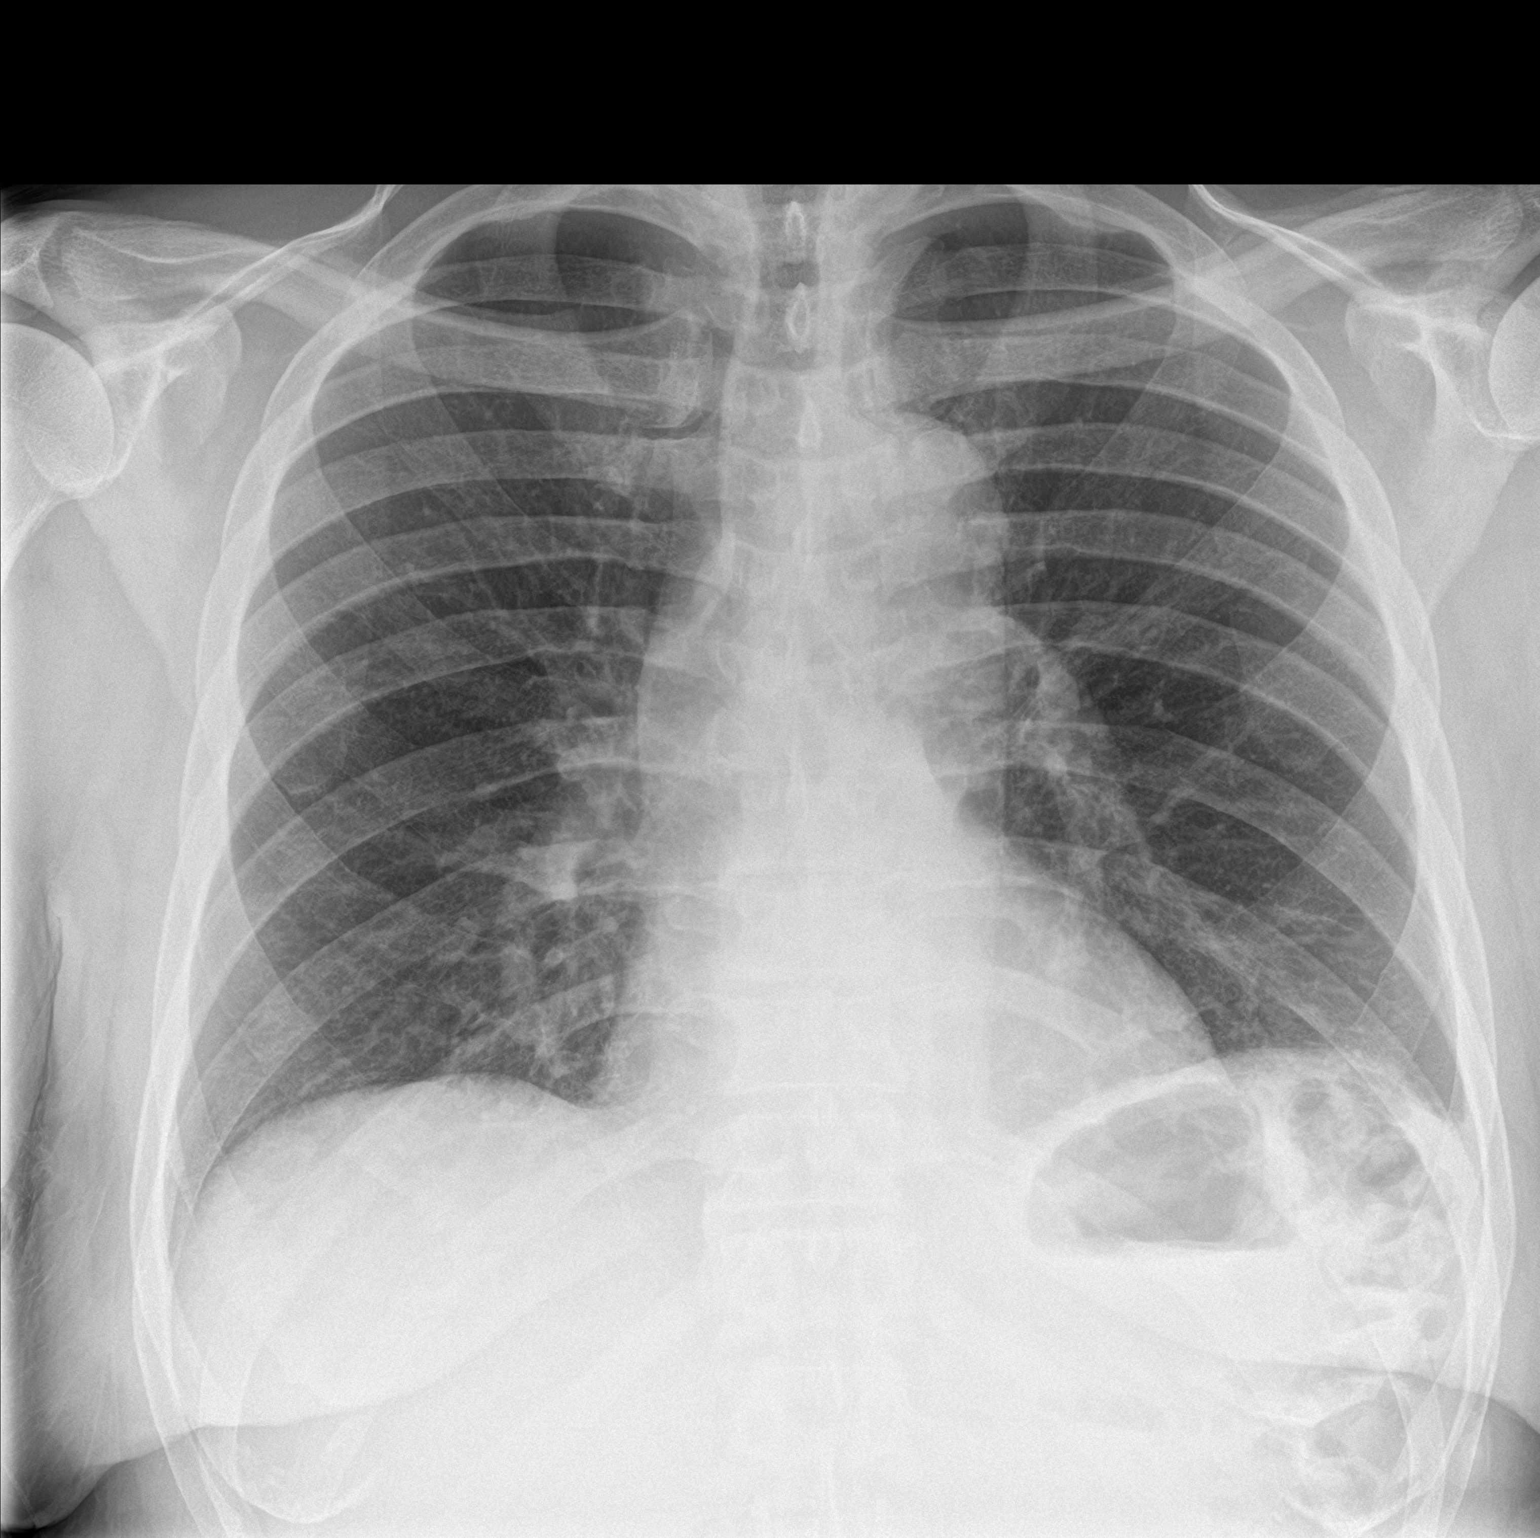

[chest lat]
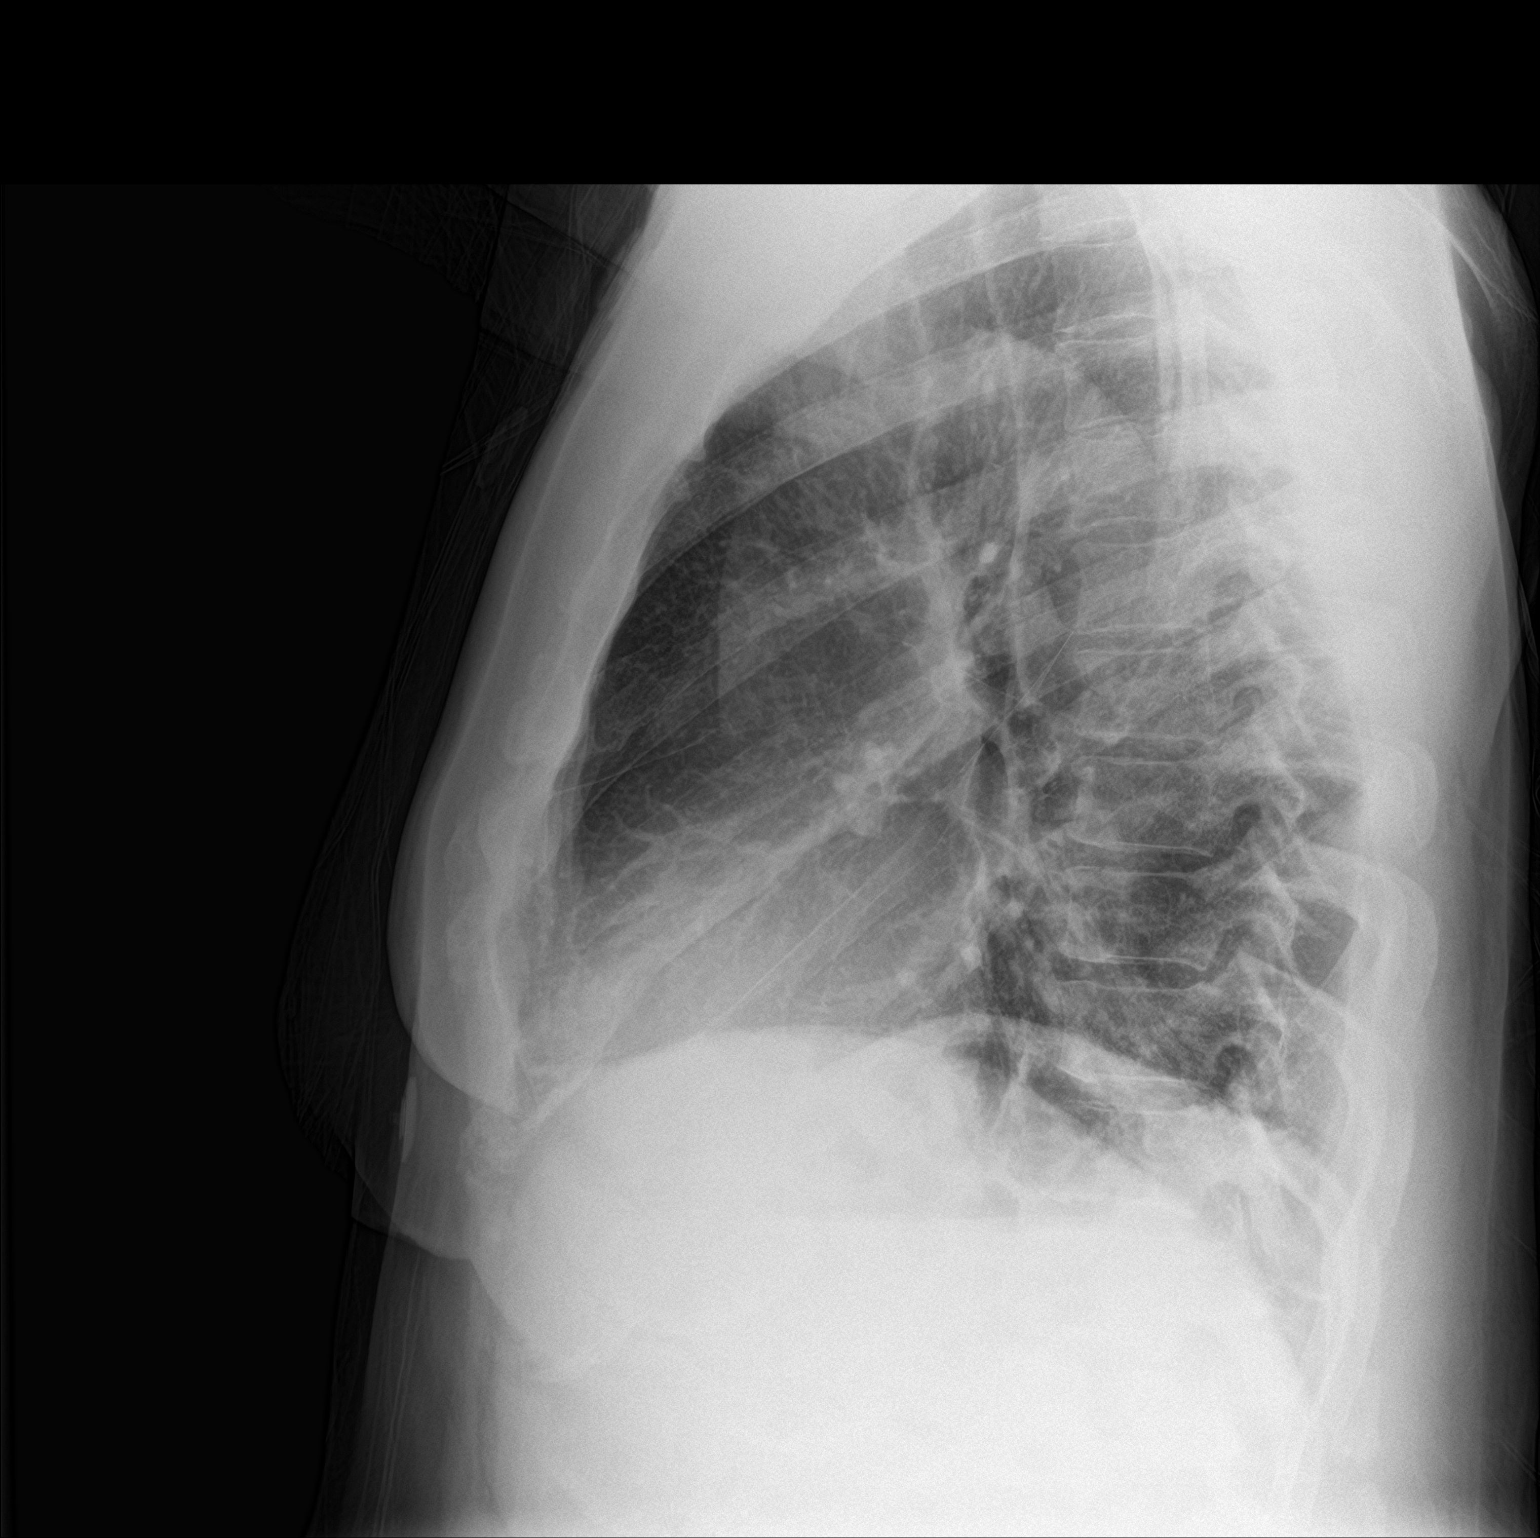

[2 of 2 positions shown; findings below may reference images not displayed]

FINDINGS: The heart size and mediastinal contours are within normal limits.
Minimal bibasilar atelectasis left greater than right as well as
atelectasis within the right middle lobe.. No effusion or
pneumothorax. The visualized skeletal structures are unremarkable.
IMPRESSION: Bibasilar and right middle lobe atelectasis. No pneumothorax or
pulmonary consolidation. No CHF.
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
## 2022-09-19 ENCOUNTER — Emergency Department (HOSPITAL_COMMUNITY): Payer: Medicaid Other | Admitting: Certified Registered Nurse Anesthetist

## 2022-09-19 ENCOUNTER — Inpatient Hospital Stay (HOSPITAL_COMMUNITY): Payer: Medicaid Other

## 2022-09-19 ENCOUNTER — Encounter (HOSPITAL_COMMUNITY): Admission: EM | Disposition: E | Payer: Self-pay | Source: Home / Self Care | Attending: Neurology

## 2022-09-19 ENCOUNTER — Inpatient Hospital Stay (HOSPITAL_COMMUNITY): Payer: Medicaid Other | Admitting: Certified Registered Nurse Anesthetist

## 2022-09-19 ENCOUNTER — Inpatient Hospital Stay (HOSPITAL_COMMUNITY)
Admission: EM | Admit: 2022-09-19 | Discharge: 2022-10-10 | DRG: 023 | Disposition: E | Payer: Medicaid Other | Attending: Neurology | Admitting: Neurology

## 2022-09-19 ENCOUNTER — Emergency Department (HOSPITAL_COMMUNITY): Payer: Medicaid Other

## 2022-09-19 DIAGNOSIS — E872 Acidosis, unspecified: Secondary | ICD-10-CM | POA: Diagnosis present

## 2022-09-19 DIAGNOSIS — E669 Obesity, unspecified: Secondary | ICD-10-CM | POA: Diagnosis present

## 2022-09-19 DIAGNOSIS — I6522 Occlusion and stenosis of left carotid artery: Secondary | ICD-10-CM | POA: Diagnosis present

## 2022-09-19 DIAGNOSIS — E1165 Type 2 diabetes mellitus with hyperglycemia: Secondary | ICD-10-CM | POA: Diagnosis present

## 2022-09-19 DIAGNOSIS — Z8249 Family history of ischemic heart disease and other diseases of the circulatory system: Secondary | ICD-10-CM

## 2022-09-19 DIAGNOSIS — J9811 Atelectasis: Secondary | ICD-10-CM | POA: Diagnosis present

## 2022-09-19 DIAGNOSIS — R001 Bradycardia, unspecified: Secondary | ICD-10-CM | POA: Diagnosis present

## 2022-09-19 DIAGNOSIS — I10 Essential (primary) hypertension: Secondary | ICD-10-CM

## 2022-09-19 DIAGNOSIS — J9601 Acute respiratory failure with hypoxia: Secondary | ICD-10-CM | POA: Diagnosis present

## 2022-09-19 DIAGNOSIS — H53461 Homonymous bilateral field defects, right side: Secondary | ICD-10-CM | POA: Diagnosis present

## 2022-09-19 DIAGNOSIS — I63412 Cerebral infarction due to embolism of left middle cerebral artery: Secondary | ICD-10-CM | POA: Diagnosis present

## 2022-09-19 DIAGNOSIS — I663 Occlusion and stenosis of cerebellar arteries: Secondary | ICD-10-CM

## 2022-09-19 DIAGNOSIS — R4701 Aphasia: Secondary | ICD-10-CM | POA: Diagnosis present

## 2022-09-19 DIAGNOSIS — R29810 Facial weakness: Secondary | ICD-10-CM | POA: Diagnosis present

## 2022-09-19 DIAGNOSIS — I639 Cerebral infarction, unspecified: Secondary | ICD-10-CM

## 2022-09-19 DIAGNOSIS — I6602 Occlusion and stenosis of left middle cerebral artery: Secondary | ICD-10-CM | POA: Diagnosis present

## 2022-09-19 DIAGNOSIS — I6381 Other cerebral infarction due to occlusion or stenosis of small artery: Secondary | ICD-10-CM | POA: Diagnosis present

## 2022-09-19 DIAGNOSIS — I119 Hypertensive heart disease without heart failure: Secondary | ICD-10-CM | POA: Diagnosis present

## 2022-09-19 DIAGNOSIS — H518 Other specified disorders of binocular movement: Secondary | ICD-10-CM | POA: Diagnosis present

## 2022-09-19 DIAGNOSIS — F17290 Nicotine dependence, other tobacco product, uncomplicated: Secondary | ICD-10-CM | POA: Diagnosis present

## 2022-09-19 DIAGNOSIS — I161 Hypertensive emergency: Secondary | ICD-10-CM | POA: Diagnosis present

## 2022-09-19 DIAGNOSIS — R1310 Dysphagia, unspecified: Secondary | ICD-10-CM | POA: Diagnosis present

## 2022-09-19 DIAGNOSIS — R918 Other nonspecific abnormal finding of lung field: Secondary | ICD-10-CM | POA: Diagnosis present

## 2022-09-19 DIAGNOSIS — I615 Nontraumatic intracerebral hemorrhage, intraventricular: Secondary | ICD-10-CM | POA: Diagnosis present

## 2022-09-19 DIAGNOSIS — E785 Hyperlipidemia, unspecified: Secondary | ICD-10-CM | POA: Diagnosis present

## 2022-09-19 DIAGNOSIS — G919 Hydrocephalus, unspecified: Secondary | ICD-10-CM | POA: Diagnosis present

## 2022-09-19 DIAGNOSIS — R451 Restlessness and agitation: Secondary | ICD-10-CM | POA: Diagnosis present

## 2022-09-19 DIAGNOSIS — E876 Hypokalemia: Secondary | ICD-10-CM | POA: Diagnosis present

## 2022-09-19 DIAGNOSIS — R471 Dysarthria and anarthria: Secondary | ICD-10-CM | POA: Diagnosis present

## 2022-09-19 DIAGNOSIS — G8191 Hemiplegia, unspecified affecting right dominant side: Secondary | ICD-10-CM | POA: Diagnosis present

## 2022-09-19 DIAGNOSIS — G936 Cerebral edema: Secondary | ICD-10-CM | POA: Diagnosis present

## 2022-09-19 DIAGNOSIS — Z781 Physical restraint status: Secondary | ICD-10-CM

## 2022-09-19 DIAGNOSIS — R29727 NIHSS score 27: Secondary | ICD-10-CM | POA: Diagnosis present

## 2022-09-19 DIAGNOSIS — Z6841 Body Mass Index (BMI) 40.0 and over, adult: Secondary | ICD-10-CM

## 2022-09-19 DIAGNOSIS — Z79899 Other long term (current) drug therapy: Secondary | ICD-10-CM

## 2022-09-19 DIAGNOSIS — Z9911 Dependence on respirator [ventilator] status: Secondary | ICD-10-CM

## 2022-09-19 DIAGNOSIS — D72829 Elevated white blood cell count, unspecified: Secondary | ICD-10-CM | POA: Diagnosis not present

## 2022-09-19 DIAGNOSIS — I63422 Cerebral infarction due to embolism of left anterior cerebral artery: Secondary | ICD-10-CM | POA: Diagnosis present

## 2022-09-19 DIAGNOSIS — Z833 Family history of diabetes mellitus: Secondary | ICD-10-CM

## 2022-09-19 DIAGNOSIS — G935 Compression of brain: Secondary | ICD-10-CM | POA: Diagnosis present

## 2022-09-19 DIAGNOSIS — I63512 Cerebral infarction due to unspecified occlusion or stenosis of left middle cerebral artery: Principal | ICD-10-CM | POA: Diagnosis present

## 2022-09-19 DIAGNOSIS — Z820 Family history of epilepsy and other diseases of the nervous system: Secondary | ICD-10-CM

## 2022-09-19 DIAGNOSIS — Z66 Do not resuscitate: Secondary | ICD-10-CM | POA: Diagnosis not present

## 2022-09-19 DIAGNOSIS — F17210 Nicotine dependence, cigarettes, uncomplicated: Secondary | ICD-10-CM

## 2022-09-19 HISTORY — PX: CRANIOTOMY: SHX93

## 2022-09-19 HISTORY — PX: IR CT HEAD LTD: IMG2386

## 2022-09-19 HISTORY — PX: RADIOLOGY WITH ANESTHESIA: SHX6223

## 2022-09-19 HISTORY — PX: IR PERCUTANEOUS ART THROMBECTOMY/INFUSION INTRACRANIAL INC DIAG ANGIO: IMG6087

## 2022-09-19 LAB — PROTIME-INR
INR: 1 (ref 0.8–1.2)
Prothrombin Time: 13.5 s (ref 11.4–15.2)

## 2022-09-19 LAB — I-STAT CHEM 8, ED
BUN: 25 mg/dL — ABNORMAL HIGH (ref 8–23)
Calcium, Ion: 1.05 mmol/L — ABNORMAL LOW (ref 1.15–1.40)
Chloride: 102 mmol/L (ref 98–111)
Creatinine, Ser: 1.2 mg/dL (ref 0.61–1.24)
Glucose, Bld: 188 mg/dL — ABNORMAL HIGH (ref 70–99)
HCT: 44 % (ref 39.0–52.0)
Hemoglobin: 15 g/dL (ref 13.0–17.0)
Potassium: 6 mmol/L — ABNORMAL HIGH (ref 3.5–5.1)
Sodium: 136 mmol/L (ref 135–145)
TCO2: 26 mmol/L (ref 22–32)

## 2022-09-19 LAB — COMPREHENSIVE METABOLIC PANEL
ALT: 36 U/L (ref 0–44)
AST: 26 U/L (ref 15–41)
Albumin: 3.7 g/dL (ref 3.5–5.0)
Alkaline Phosphatase: 68 U/L (ref 38–126)
Anion gap: 7 (ref 5–15)
BUN: 14 mg/dL (ref 8–23)
CO2: 24 mmol/L (ref 22–32)
Calcium: 8.7 mg/dL — ABNORMAL LOW (ref 8.9–10.3)
Chloride: 104 mmol/L (ref 98–111)
Creatinine, Ser: 1.01 mg/dL (ref 0.61–1.24)
GFR, Estimated: >60 mL/min (ref >60)
Glucose, Bld: 143 mg/dL — ABNORMAL HIGH (ref 70–99)
Potassium: 4.2 mmol/L (ref 3.5–5.1)
Sodium: 135 mmol/L (ref 135–145)
Total Bilirubin: 0.3 mg/dL (ref 0.3–1.2)
Total Protein: 7.3 g/dL (ref 6.5–8.1)

## 2022-09-19 LAB — APTT: aPTT: 30 s (ref 24–36)

## 2022-09-19 LAB — DIFFERENTIAL
Abs Immature Granulocytes: 0.01 10*3/uL (ref 0.00–0.07)
Basophils Absolute: 0 10*3/uL (ref 0.0–0.1)
Basophils Relative: 0 %
Eosinophils Absolute: 0 10*3/uL (ref 0.0–0.5)
Eosinophils Relative: 1 %
Immature Granulocytes: 0 %
Lymphocytes Relative: 59 %
Lymphs Abs: 3.7 10*3/uL (ref 0.7–4.0)
Monocytes Absolute: 0.4 10*3/uL (ref 0.1–1.0)
Monocytes Relative: 6 %
Neutro Abs: 2.1 10*3/uL (ref 1.7–7.7)
Neutrophils Relative %: 34 %

## 2022-09-19 LAB — RAPID URINE DRUG SCREEN, HOSP PERFORMED
Amphetamines: NOT DETECTED
Barbiturates: NOT DETECTED
Benzodiazepines: NOT DETECTED
Cocaine: NOT DETECTED
Opiates: NOT DETECTED
Tetrahydrocannabinol: NOT DETECTED

## 2022-09-19 LAB — HIV ANTIBODY (ROUTINE TESTING W REFLEX): HIV Screen 4th Generation wRfx: NONREACTIVE

## 2022-09-19 LAB — URINALYSIS, ROUTINE W REFLEX MICROSCOPIC
Bacteria, UA: NONE SEEN
Bilirubin Urine: NEGATIVE
Glucose, UA: NEGATIVE mg/dL
Ketones, ur: NEGATIVE mg/dL
Leukocytes,Ua: NEGATIVE
Nitrite: NEGATIVE
Protein, ur: NEGATIVE mg/dL
Specific Gravity, Urine: 1.013 (ref 1.005–1.030)
pH: 5 (ref 5.0–8.0)

## 2022-09-19 LAB — LIPID PANEL
Cholesterol: 170 mg/dL (ref 0–200)
HDL: 35 mg/dL — ABNORMAL LOW (ref >40)
LDL Cholesterol: 109 mg/dL — ABNORMAL HIGH (ref 0–99)
Total CHOL/HDL Ratio: 4.9 ratio
Triglycerides: 130 mg/dL (ref <150)
VLDL: 26 mg/dL (ref 0–40)

## 2022-09-19 LAB — GLUCOSE, CAPILLARY
Glucose-Capillary: 164 mg/dL — ABNORMAL HIGH (ref 70–99)
Glucose-Capillary: 172 mg/dL — ABNORMAL HIGH (ref 70–99)
Glucose-Capillary: 148 mg/dL — ABNORMAL HIGH (ref 70–99)
Glucose-Capillary: 161 mg/dL — ABNORMAL HIGH (ref 70–99)

## 2022-09-19 LAB — CBC WITH DIFFERENTIAL/PLATELET
Abs Immature Granulocytes: 0.04 10*3/uL (ref 0.00–0.07)
Basophils Absolute: 0 10*3/uL (ref 0.0–0.1)
Basophils Relative: 0 %
Eosinophils Absolute: 0 10*3/uL (ref 0.0–0.5)
Eosinophils Relative: 0 %
HCT: 45.1 % (ref 39.0–52.0)
Hemoglobin: 14.8 g/dL (ref 13.0–17.0)
Immature Granulocytes: 1 %
Lymphocytes Relative: 17 %
Lymphs Abs: 1 10*3/uL (ref 0.7–4.0)
MCH: 27.7 pg (ref 26.0–34.0)
MCHC: 32.8 g/dL (ref 30.0–36.0)
MCV: 84.3 fL (ref 80.0–100.0)
Monocytes Absolute: 0.3 10*3/uL (ref 0.1–1.0)
Monocytes Relative: 5 %
Neutro Abs: 4.5 10*3/uL (ref 1.7–7.7)
Neutrophils Relative %: 77 %
Platelets: 195 10*3/uL (ref 150–400)
RBC: 5.35 MIL/uL (ref 4.22–5.81)
RDW: 13.2 % (ref 11.5–15.5)
WBC: 5.8 10*3/uL (ref 4.0–10.5)
nRBC: 0 % (ref 0.0–0.2)

## 2022-09-19 LAB — CBC
HCT: 45.3 % (ref 39.0–52.0)
Hemoglobin: 15 g/dL (ref 13.0–17.0)
MCH: 28.3 pg (ref 26.0–34.0)
MCHC: 33.1 g/dL (ref 30.0–36.0)
MCV: 85.5 fL (ref 80.0–100.0)
Platelets: UNDETERMINED 10*3/uL (ref 150–400)
RBC: 5.3 MIL/uL (ref 4.22–5.81)
RDW: 13.6 % (ref 11.5–15.5)
WBC: 6.1 10*3/uL (ref 4.0–10.5)
nRBC: 0 % (ref 0.0–0.2)

## 2022-09-19 LAB — SODIUM
Sodium: 138 mmol/L (ref 135–145)
Sodium: 144 mmol/L (ref 135–145)

## 2022-09-19 LAB — CBG MONITORING, ED: Glucose-Capillary: 182 mg/dL — ABNORMAL HIGH (ref 70–99)

## 2022-09-19 LAB — PHOSPHORUS
Phosphorus: 2.9 mg/dL (ref 2.5–4.6)
Phosphorus: 4.2 mg/dL (ref 2.5–4.6)

## 2022-09-19 LAB — MAGNESIUM
Magnesium: 1.9 mg/dL (ref 1.7–2.4)
Magnesium: 2 mg/dL (ref 1.7–2.4)

## 2022-09-19 LAB — HEMOGLOBIN A1C
Hgb A1c MFr Bld: 6.7 % — ABNORMAL HIGH (ref 4.8–5.6)
Mean Plasma Glucose: 145.59 mg/dL

## 2022-09-19 LAB — MRSA NEXT GEN BY PCR, NASAL: MRSA by PCR Next Gen: NOT DETECTED

## 2022-09-19 LAB — I-STAT CG4 LACTIC ACID, ED: Lactic Acid, Venous: 2.4 mmol/L (ref 0.5–1.9)

## 2022-09-19 LAB — ETHANOL: Alcohol, Ethyl (B): <10 mg/dL (ref <10)

## 2022-09-19 SURGERY — CRANIOTOMY HEMATOMA EVACUATION SUBDURAL
Anesthesia: General | Site: Head | Laterality: Left

## 2022-09-19 SURGERY — RADIOLOGY WITH ANESTHESIA
Anesthesia: General

## 2022-09-19 MED ORDER — FAMOTIDINE 20 MG PO TABS
20.0000 mg | ORAL_TABLET | Freq: Two times a day (BID) | ORAL | Status: DC
  Administered 2022-09-19 – 2022-09-21 (×5): 20 mg
  Filled 2022-09-19 (×6): qty 1

## 2022-09-19 MED ORDER — STROKE: EARLY STAGES OF RECOVERY BOOK: Freq: Once | Status: AC

## 2022-09-19 MED ORDER — SENNOSIDES-DOCUSATE SODIUM 8.6-50 MG PO TABS: 1.0000 | ORAL_TABLET | Freq: Every evening | ORAL | Status: DC | PRN

## 2022-09-19 MED ORDER — ACETAMINOPHEN 650 MG RE SUPP: 650.0000 mg | RECTAL | Status: DC | PRN

## 2022-09-19 MED ORDER — SODIUM CHLORIDE 0.9 % IV BOLUS: 250.0000 mL | INTRAVENOUS | Status: AC | PRN

## 2022-09-19 MED ORDER — HYDRALAZINE HCL 20 MG/ML IJ SOLN: 10.0000 mg | Freq: Four times a day (QID) | INTRAMUSCULAR | Status: DC | PRN

## 2022-09-19 MED ORDER — PROPOFOL 10 MG/ML IV BOLUS
INTRAVENOUS | Status: DC | PRN
  Administered 2022-09-19: 30 mg via INTRAVENOUS

## 2022-09-19 MED ORDER — ONDANSETRON HCL 4 MG/2ML IJ SOLN
INTRAMUSCULAR | Status: DC | PRN
  Administered 2022-09-19: 4 mg via INTRAVENOUS

## 2022-09-19 MED ORDER — IOHEXOL 300 MG/ML  SOLN
50.0000 mL | Freq: Once | INTRAMUSCULAR | Status: AC | PRN
  Administered 2022-09-19: 10 mL via INTRA_ARTERIAL

## 2022-09-19 MED ORDER — NOREPINEPHRINE 4 MG/250ML-% IV SOLN
INTRAVENOUS | Status: AC
  Filled 2022-09-19: qty 250

## 2022-09-19 MED ORDER — ORAL CARE MOUTH RINSE: 15.0000 mL | OROMUCOSAL | Status: DC | PRN

## 2022-09-19 MED ORDER — FENTANYL 2500MCG IN NS 250ML (10MCG/ML) PREMIX INFUSION
50.0000 ug/h | INTRAVENOUS | Status: DC
  Administered 2022-09-19 – 2022-09-22 (×2): 50 ug/h via INTRAVENOUS
  Filled 2022-09-19 (×2): qty 250

## 2022-09-19 MED ORDER — CEFAZOLIN SODIUM-DEXTROSE 2-3 GM-%(50ML) IV SOLR
INTRAVENOUS | Status: DC | PRN
Start: 2022-09-19 — End: 2022-09-19
  Administered 2022-09-19: 3 g via INTRAVENOUS

## 2022-09-19 MED ORDER — FENTANYL BOLUS VIA INFUSION: 50.0000 ug | INTRAVENOUS | Status: DC | PRN

## 2022-09-19 MED ORDER — LIDOCAINE-EPINEPHRINE 1 %-1:100000 IJ SOLN
INTRAMUSCULAR | Status: AC
  Filled 2022-09-19: qty 1

## 2022-09-19 MED ORDER — NITROGLYCERIN 1 MG/10 ML FOR IR/CATH LAB
INTRA_ARTERIAL | Status: AC
  Filled 2022-09-19: qty 10

## 2022-09-19 MED ORDER — FENTANYL CITRATE PF 50 MCG/ML IJ SOSY
50.0000 ug | PREFILLED_SYRINGE | Freq: Once | INTRAMUSCULAR | Status: AC
  Administered 2022-09-19: 50 ug via INTRAVENOUS
  Filled 2022-09-19: qty 1

## 2022-09-19 MED ORDER — SODIUM CHLORIDE (PF) 0.9 % IJ SOLN
INTRAVENOUS | Status: DC | PRN
  Administered 2022-09-19: 25 ug via INTRA_ARTERIAL

## 2022-09-19 MED ORDER — LACTATED RINGERS IV SOLN
INTRAVENOUS | Status: DC | PRN
Start: 2022-09-19 — End: 2022-09-19

## 2022-09-19 MED ORDER — SODIUM CHLORIDE 0.9 % IV SOLN: INTRAVENOUS | Status: DC | PRN

## 2022-09-19 MED ORDER — ENOXAPARIN SODIUM 80 MG/0.8ML IJ SOSY
75.0000 mg | PREFILLED_SYRINGE | Freq: Every day | INTRAMUSCULAR | Status: DC
  Filled 2022-09-19: qty 0.75

## 2022-09-19 MED ORDER — THROMBIN 20000 UNITS EX SOLR: CUTANEOUS | Status: DC | PRN

## 2022-09-19 MED ORDER — BACITRACIN ZINC 500 UNIT/GM EX OINT
TOPICAL_OINTMENT | CUTANEOUS | Status: AC
  Filled 2022-09-19: qty 28.35

## 2022-09-19 MED ORDER — CHLORHEXIDINE GLUCONATE CLOTH 2 % EX PADS
6.0000 | MEDICATED_PAD | Freq: Every day | CUTANEOUS | Status: DC
  Administered 2022-09-19 – 2022-09-21 (×3): 6 via TOPICAL

## 2022-09-19 MED ORDER — THROMBIN 20000 UNITS EX SOLR
CUTANEOUS | Status: AC
  Filled 2022-09-19: qty 20000

## 2022-09-19 MED ORDER — PHENYLEPHRINE 80 MCG/ML (10ML) SYRINGE FOR IV PUSH (FOR BLOOD PRESSURE SUPPORT)
PREFILLED_SYRINGE | INTRAVENOUS | Status: DC | PRN
  Administered 2022-09-19 (×4): 80 ug via INTRAVENOUS

## 2022-09-19 MED ORDER — ACETAMINOPHEN 160 MG/5ML PO SOLN
650.0000 mg | ORAL | Status: DC | PRN
  Administered 2022-09-21 – 2022-09-22 (×2): 650 mg
  Filled 2022-09-19 (×2): qty 20.3

## 2022-09-19 MED ORDER — IOHEXOL 350 MG/ML SOLN
75.0000 mL | Freq: Once | INTRAVENOUS | Status: AC | PRN
  Administered 2022-09-19: 75 mL via INTRAVENOUS

## 2022-09-19 MED ORDER — ALBUTEROL SULFATE (2.5 MG/3ML) 0.083% IN NEBU: 2.5000 mg | INHALATION_SOLUTION | RESPIRATORY_TRACT | Status: DC | PRN

## 2022-09-19 MED ORDER — CEFAZOLIN SODIUM 1 G IJ SOLR
INTRAMUSCULAR | Status: AC
  Filled 2022-09-19: qty 30

## 2022-09-19 MED ORDER — PROPOFOL 1000 MG/100ML IV EMUL
0.0000 ug/kg/min | INTRAVENOUS | Status: DC
  Administered 2022-09-19: 40 ug/kg/min via INTRAVENOUS
  Administered 2022-09-19 (×2): 50 ug/kg/min via INTRAVENOUS
  Administered 2022-09-19 (×2): 40 ug/kg/min via INTRAVENOUS
  Administered 2022-09-19: 50 ug/kg/min via INTRAVENOUS
  Administered 2022-09-20: 20 ug/kg/min via INTRAVENOUS
  Administered 2022-09-20: 40 ug/kg/min via INTRAVENOUS
  Filled 2022-09-19: qty 200
  Filled 2022-09-19 (×7): qty 100

## 2022-09-19 MED ORDER — PHENYLEPHRINE HCL-NACL 20-0.9 MG/250ML-% IV SOLN
INTRAVENOUS | Status: DC | PRN
  Administered 2022-09-19: 20 ug/min via INTRAVENOUS

## 2022-09-19 MED ORDER — DEXAMETHASONE SODIUM PHOSPHATE 10 MG/ML IJ SOLN
INTRAMUSCULAR | Status: DC | PRN
  Administered 2022-09-19: 10 mg via INTRAVENOUS

## 2022-09-19 MED ORDER — ONDANSETRON HCL 4 MG PO TABS: 4.0000 mg | ORAL_TABLET | ORAL | Status: DC | PRN

## 2022-09-19 MED ORDER — 0.9 % SODIUM CHLORIDE (POUR BTL) OPTIME
TOPICAL | Status: DC | PRN
  Administered 2022-09-19: 3000 mL

## 2022-09-19 MED ORDER — ORAL CARE MOUTH RINSE
15.0000 mL | OROMUCOSAL | Status: DC
  Administered 2022-09-19: 15 mL via OROMUCOSAL

## 2022-09-19 MED ORDER — ATORVASTATIN CALCIUM 80 MG PO TABS
80.0000 mg | ORAL_TABLET | Freq: Every day | ORAL | Status: DC
  Administered 2022-09-20 – 2022-09-21 (×2): 80 mg
  Filled 2022-09-19 (×3): qty 1

## 2022-09-19 MED ORDER — CEFAZOLIN SODIUM-DEXTROSE 2-3 GM-%(50ML) IV SOLR
INTRAVENOUS | Status: DC | PRN
Start: 2022-09-19 — End: 2022-09-19
  Administered 2022-09-19: 2 g via INTRAVENOUS

## 2022-09-19 MED ORDER — CLEVIDIPINE BUTYRATE 0.5 MG/ML IV EMUL
0.0000 mg/h | INTRAVENOUS | Status: AC
  Administered 2022-09-19: 5 mg/h via INTRAVENOUS
  Filled 2022-09-19: qty 50

## 2022-09-19 MED ORDER — PANTOPRAZOLE SODIUM 40 MG IV SOLR: 40.0000 mg | Freq: Every day | INTRAVENOUS | Status: DC

## 2022-09-19 MED ORDER — ONDANSETRON HCL 4 MG/2ML IJ SOLN: 4.0000 mg | INTRAMUSCULAR | Status: DC | PRN

## 2022-09-19 MED ORDER — ROCURONIUM BROMIDE 10 MG/ML (PF) SYRINGE
PREFILLED_SYRINGE | INTRAVENOUS | Status: DC | PRN
  Administered 2022-09-19: 50 mg via INTRAVENOUS

## 2022-09-19 MED ORDER — FENTANYL CITRATE (PF) 100 MCG/2ML IJ SOLN
INTRAMUSCULAR | Status: AC
  Filled 2022-09-19: qty 2

## 2022-09-19 MED ORDER — THROMBIN 5000 UNITS EX SOLR: OROMUCOSAL | Status: DC | PRN

## 2022-09-19 MED ORDER — IOHEXOL 300 MG/ML  SOLN
150.0000 mL | Freq: Once | INTRAMUSCULAR | Status: AC | PRN
  Administered 2022-09-19: 80 mL via INTRA_ARTERIAL

## 2022-09-19 MED ORDER — PROPOFOL 1000 MG/100ML IV EMUL
5.0000 ug/kg/min | INTRAVENOUS | Status: DC
  Administered 2022-09-19: 30 ug/kg/min via INTRAVENOUS

## 2022-09-19 MED ORDER — INSULIN ASPART 100 UNIT/ML IJ SOLN
2.0000 [IU] | INTRAMUSCULAR | Status: DC
  Administered 2022-09-19 (×3): 4 [IU] via SUBCUTANEOUS
  Administered 2022-09-19: 3 [IU] via SUBCUTANEOUS
  Administered 2022-09-20 (×4): 4 [IU] via SUBCUTANEOUS
  Administered 2022-09-20: 6 [IU] via SUBCUTANEOUS
  Administered 2022-09-21: 2 [IU] via SUBCUTANEOUS
  Administered 2022-09-21 (×5): 4 [IU] via SUBCUTANEOUS
  Administered 2022-09-22: 2 [IU] via SUBCUTANEOUS

## 2022-09-19 MED ORDER — LACTATED RINGERS IV SOLN: INTRAVENOUS | Status: DC | PRN

## 2022-09-19 MED ORDER — IOHEXOL 300 MG/ML  SOLN: 100.0000 mL | Freq: Once | INTRAMUSCULAR | Status: DC | PRN

## 2022-09-19 MED ORDER — SODIUM CHLORIDE 0.9 % IV SOLN
INTRAVENOUS | Status: DC | PRN
Start: 2022-09-19 — End: 2022-09-19

## 2022-09-19 MED ORDER — CALCIUM GLUCONATE-NACL 2-0.675 GM/100ML-% IV SOLN
2.0000 g | Freq: Once | INTRAVENOUS | Status: AC
  Administered 2022-09-19: 2000 mg via INTRAVENOUS
  Filled 2022-09-19: qty 100

## 2022-09-19 MED ORDER — SODIUM CHLORIDE 0.9 % IV SOLN: INTRAVENOUS | Status: DC

## 2022-09-19 MED ORDER — CEFAZOLIN IN SODIUM CHLORIDE 3-0.9 GM/100ML-% IV SOLN
3.0000 g | Freq: Three times a day (TID) | INTRAVENOUS | Status: AC
  Administered 2022-09-19 – 2022-09-20 (×2): 3 g via INTRAVENOUS
  Filled 2022-09-19 (×2): qty 100

## 2022-09-19 MED ORDER — ALBUTEROL SULFATE (2.5 MG/3ML) 0.083% IN NEBU
2.5000 mg | INHALATION_SOLUTION | RESPIRATORY_TRACT | Status: DC
  Administered 2022-09-19 – 2022-09-22 (×17): 2.5 mg via RESPIRATORY_TRACT
  Filled 2022-09-19 (×17): qty 3

## 2022-09-19 MED ORDER — IOHEXOL 350 MG/ML SOLN
45.0000 mL | Freq: Once | INTRAVENOUS | Status: AC | PRN
  Administered 2022-09-19: 45 mL via INTRAVENOUS

## 2022-09-19 MED ORDER — CLEVIDIPINE BUTYRATE 0.5 MG/ML IV EMUL
INTRAVENOUS | Status: DC | PRN
Start: 2022-09-19 — End: 2022-09-19
  Administered 2022-09-19: 2 mg/h via INTRAVENOUS

## 2022-09-19 MED ORDER — ORAL CARE MOUTH RINSE
15.0000 mL | OROMUCOSAL | Status: DC
  Administered 2022-09-19 – 2022-09-22 (×29): 15 mL via OROMUCOSAL

## 2022-09-19 MED ORDER — LEVETIRACETAM IN NACL 500 MG/100ML IV SOLN
500.0000 mg | Freq: Two times a day (BID) | INTRAVENOUS | Status: DC
  Administered 2022-09-19 – 2022-09-21 (×5): 500 mg via INTRAVENOUS
  Filled 2022-09-19 (×5): qty 100

## 2022-09-19 MED ORDER — PANTOPRAZOLE SODIUM 40 MG IV SOLR
40.0000 mg | Freq: Every day | INTRAVENOUS | Status: DC
  Administered 2022-09-19: 40 mg via INTRAVENOUS
  Filled 2022-09-19: qty 10

## 2022-09-19 MED ORDER — POLYETHYLENE GLYCOL 3350 17 G PO PACK
17.0000 g | PACK | Freq: Every day | ORAL | Status: DC
  Administered 2022-09-20 – 2022-09-21 (×2): 17 g
  Filled 2022-09-19 (×3): qty 1

## 2022-09-19 MED ORDER — PROMETHAZINE HCL 25 MG PO TABS: 12.5000 mg | ORAL_TABLET | ORAL | Status: DC | PRN

## 2022-09-19 MED ORDER — THROMBIN 5000 UNITS EX SOLR
CUTANEOUS | Status: AC
  Filled 2022-09-19: qty 5000

## 2022-09-19 MED ORDER — OSMOLITE 1.5 CAL PO LIQD
1000.0000 mL | ORAL | Status: DC
  Administered 2022-09-19 – 2022-09-22 (×3): 1000 mL

## 2022-09-19 MED ORDER — DOCUSATE SODIUM 50 MG/5ML PO LIQD
100.0000 mg | Freq: Two times a day (BID) | ORAL | Status: DC
  Administered 2022-09-19 – 2022-09-21 (×5): 100 mg
  Filled 2022-09-19 (×6): qty 10

## 2022-09-19 MED ORDER — ALBUMIN HUMAN 5 % IV SOLN
INTRAVENOUS | Status: DC | PRN
Start: 2022-09-19 — End: 2022-09-19

## 2022-09-19 MED ORDER — ENOXAPARIN SODIUM 40 MG/0.4ML IJ SOSY: 40.0000 mg | PREFILLED_SYRINGE | Freq: Every day | INTRAMUSCULAR | Status: DC

## 2022-09-19 MED ORDER — CEFAZOLIN SODIUM-DEXTROSE 2-4 GM/100ML-% IV SOLN
INTRAVENOUS | Status: AC
  Filled 2022-09-19: qty 100

## 2022-09-19 MED ORDER — FENTANYL CITRATE PF 50 MCG/ML IJ SOSY
100.0000 ug | PREFILLED_SYRINGE | Freq: Once | INTRAMUSCULAR | Status: AC
  Administered 2022-09-19: 100 ug via INTRAVENOUS

## 2022-09-19 MED ORDER — PHENYLEPHRINE 80 MCG/ML (10ML) SYRINGE FOR IV PUSH (FOR BLOOD PRESSURE SUPPORT)
PREFILLED_SYRINGE | INTRAVENOUS | Status: DC | PRN
  Administered 2022-09-19 (×3): 80 ug via INTRAVENOUS
  Administered 2022-09-19: 40 ug via INTRAVENOUS

## 2022-09-19 MED ORDER — SODIUM CHLORIDE 3 % IV SOLN
INTRAVENOUS | Status: DC
  Filled 2022-09-19 (×3): qty 500

## 2022-09-19 MED ORDER — LIDOCAINE-EPINEPHRINE 1 %-1:100000 IJ SOLN
INTRAMUSCULAR | Status: DC | PRN
  Administered 2022-09-19: 17 mL
  Administered 2022-09-19: 20 mL

## 2022-09-19 MED ORDER — ACETAMINOPHEN 325 MG PO TABS: 650.0000 mg | ORAL_TABLET | ORAL | Status: DC | PRN

## 2022-09-19 MED ORDER — PIVOT 1.5 CAL PO LIQD: 1000.0000 mL | ORAL | Status: DC

## 2022-09-19 MED ORDER — ACETAMINOPHEN 160 MG/5ML PO SOLN: 650.0000 mg | ORAL | Status: DC | PRN

## 2022-09-19 MED ORDER — LABETALOL HCL 5 MG/ML IV SOLN: 10.0000 mg | INTRAVENOUS | Status: DC | PRN

## 2022-09-19 MED ORDER — SODIUM CHLORIDE 3 % IV BOLUS
250.0000 mL | Freq: Once | INTRAVENOUS | Status: AC
  Administered 2022-09-19: 250 mL via INTRAVENOUS

## 2022-09-19 MED ORDER — PHENYLEPHRINE HCL-NACL 20-0.9 MG/250ML-% IV SOLN
INTRAVENOUS | Status: DC | PRN
Start: 2022-09-19 — End: 2022-09-19
  Administered 2022-09-19: 25 ug/min via INTRAVENOUS

## 2022-09-19 MED ORDER — ROCURONIUM BROMIDE 10 MG/ML (PF) SYRINGE
PREFILLED_SYRINGE | INTRAVENOUS | Status: DC | PRN
Start: 2022-09-19 — End: 2022-09-19
  Administered 2022-09-19: 70 mg via INTRAVENOUS
  Administered 2022-09-19: 30 mg via INTRAVENOUS
  Administered 2022-09-19: 20 mg via INTRAVENOUS

## 2022-09-19 MED ORDER — CLEVIDIPINE BUTYRATE 0.5 MG/ML IV EMUL
INTRAVENOUS | Status: AC
  Filled 2022-09-19: qty 50

## 2022-09-19 MED ORDER — PROSOURCE TF20 ENFIT COMPATIBL EN LIQD
60.0000 mL | Freq: Every day | ENTERAL | Status: DC
  Administered 2022-09-20 – 2022-09-21 (×2): 60 mL
  Filled 2022-09-19 (×3): qty 60

## 2022-09-19 SURGICAL SUPPLY — 83 items
ADH SKN CLS APL DERMABOND .7 (GAUZE/BANDAGES/DRESSINGS) ×1
APL SKNCLS STERI-STRIP NONHPOA (GAUZE/BANDAGES/DRESSINGS) ×1
BAG COUNTER SPONGE SURGICOUNT (BAG) ×1 IMPLANT
BAG DRN CSF CATH SYS STRL MNTR (MISCELLANEOUS)
BAG SPNG CNTER NS LX DISP (BAG) ×1
BENZOIN TINCTURE PRP APPL 2/3 (GAUZE/BANDAGES/DRESSINGS) ×1 IMPLANT
BLADE CLIPPER SURG (BLADE) ×1 IMPLANT
BNDG GAUZE DERMACEA FLUFF 4 (GAUZE/BANDAGES/DRESSINGS) IMPLANT
BNDG GZE DERMACEA 4 6PLY (GAUZE/BANDAGES/DRESSINGS)
BRR ADH 6X5 SEPRAFILM 1 SHT (MISCELLANEOUS)
BUR ACORN 9.0 PRECISION (BURR) ×1 IMPLANT
BUR SPIRAL ROUTER 2.3 (BUR) ×1 IMPLANT
CANISTER SUCT 3000ML PPV (MISCELLANEOUS) ×1 IMPLANT
CATH VENTRIC 35X38 W/TROCAR LG (CATHETERS) IMPLANT
CLIP TI MEDIUM 6 (CLIP) IMPLANT
DERMABOND ADVANCED .7 DNX12 (GAUZE/BANDAGES/DRESSINGS) ×1 IMPLANT
DRAIN RELI 100 BL SUC LF ST (DRAIN)
DRAPE MICROSCOPE SLANT 54X150 (MISCELLANEOUS) IMPLANT
DRAPE NEUROLOGICAL W/INCISE (DRAPES) ×1 IMPLANT
DRAPE SHEET LG 3/4 BI-LAMINATE (DRAPES) ×1 IMPLANT
DRAPE WARM FLUID 44X44 (DRAPES) ×1 IMPLANT
DRESSING AQUACEL AG SP 3.5X10 (GAUZE/BANDAGES/DRESSINGS) IMPLANT
DRESSING MEPILEX FLEX 4X4 (GAUZE/BANDAGES/DRESSINGS) IMPLANT
DRSG AQUACEL AG ADV 3.5X 6 (GAUZE/BANDAGES/DRESSINGS) ×1 IMPLANT
DRSG AQUACEL AG ADV 3.5X10 (GAUZE/BANDAGES/DRESSINGS) IMPLANT
DRSG AQUACEL AG SP 3.5X10 (GAUZE/BANDAGES/DRESSINGS) ×2 IMPLANT
DRSG MEPILEX FLEX 4X4 (GAUZE/BANDAGES/DRESSINGS) IMPLANT
DRSG XEROFORM 1X8 (GAUZE/BANDAGES/DRESSINGS) IMPLANT
DURAGUARD 06CMX08CM IMPLANT
DURAPREP 26ML APPLICATOR (WOUND CARE) ×1 IMPLANT
ELECT COATED BLADE 2.86 ST (ELECTRODE) ×1 IMPLANT
ELECT REM PT RETURN 9FT ADLT (ELECTROSURGICAL) ×1 IMPLANT
ELECTRODE REM PT RTRN 9FT ADLT (ELECTROSURGICAL) ×1 IMPLANT
EVACUATOR 1/8 PVC DRAIN (DRAIN) IMPLANT
EVACUATOR SILICONE 100CC (DRAIN) IMPLANT
FORCEPS BIPOLAR SPETZLER 8 1.0 (NEUROSURGERY SUPPLIES) ×1 IMPLANT
GAUZE 4X4 16PLY ~~LOC~~+RFID DBL (SPONGE) IMPLANT
GAUZE SPONGE 4X4 12PLY STRL (GAUZE/BANDAGES/DRESSINGS) ×1 IMPLANT
GLOVE BIO SURGEON STRL SZ7 (GLOVE) ×2 IMPLANT
GLOVE BIOGEL PI IND STRL 7.5 (GLOVE) ×3 IMPLANT
GLOVE ECLIPSE 7.5 STRL STRAW (GLOVE) ×1 IMPLANT
GOWN STRL REUS W/ TWL LRG LVL3 (GOWN DISPOSABLE) ×2 IMPLANT
GOWN STRL REUS W/ TWL XL LVL3 (GOWN DISPOSABLE) ×1 IMPLANT
GOWN STRL REUS W/TWL 2XL LVL3 (GOWN DISPOSABLE) IMPLANT
GOWN STRL REUS W/TWL LRG LVL3 (GOWN DISPOSABLE) ×2
GOWN STRL REUS W/TWL XL LVL3 (GOWN DISPOSABLE) ×1
GRAFT DURAGEN MATRIX 5WX7L (Graft) IMPLANT
HEMOSTAT POWDER KIT SURGIFOAM (HEMOSTASIS) ×1 IMPLANT
HEMOSTAT SURGICEL 2X14 (HEMOSTASIS) ×1 IMPLANT
HOOK RETRACTION 12 ELAST STAY (MISCELLANEOUS) IMPLANT
KIT BASIN OR (CUSTOM PROCEDURE TRAY) ×1 IMPLANT
KIT TURNOVER KIT B (KITS) ×1 IMPLANT
NDL HYPO 22X1.5 SAFETY MO (MISCELLANEOUS) ×1 IMPLANT
NEEDLE HYPO 22X1.5 SAFETY MO (MISCELLANEOUS) ×1 IMPLANT
NS IRRIG 1000ML POUR BTL (IV SOLUTION) ×3 IMPLANT
PACK CRANIOTOMY CUSTOM (CUSTOM PROCEDURE TRAY) ×1 IMPLANT
PATTIES SURGICAL .5 X.5 (GAUZE/BANDAGES/DRESSINGS) IMPLANT
PATTIES SURGICAL .5 X3 (DISPOSABLE) IMPLANT
PATTIES SURGICAL 1X1 (DISPOSABLE) IMPLANT
PERFORATOR LRG 14-11MM (BIT) IMPLANT
SEPRAFILM MEMBRANE 5X6 (MISCELLANEOUS) IMPLANT
SPONGE NEURO XRAY DETECT 1X3 (DISPOSABLE) IMPLANT
SPONGE SURGIFOAM ABS GEL 100 (HEMOSTASIS) ×1 IMPLANT
SPONGE T-LAP 18X18 ~~LOC~~+RFID (SPONGE) ×1 IMPLANT
STAPLER VISISTAT 35W (STAPLE) ×2 IMPLANT
STOCKINETTE 6 STRL (DRAPES) ×1 IMPLANT
STRIP CLOSURE SKIN 1/2X4 (GAUZE/BANDAGES/DRESSINGS) ×1 IMPLANT
SUT ETHILON 3 0 FSL (SUTURE) IMPLANT
SUT ETHILON 3 0 PS 1 (SUTURE) IMPLANT
SUT MON AB 3-0 SH 27 (SUTURE) ×1
SUT MON AB 3-0 SH27 (SUTURE) IMPLANT
SUT NURALON 4 0 TR CR/8 (SUTURE) IMPLANT
SUT VIC AB 2-0 CP2 18 (SUTURE) ×2 IMPLANT
SUT VIC AB 3-0 SH 8-18 (SUTURE) IMPLANT
SUT VICRYL RAPIDE 3 0 (SUTURE) IMPLANT
SYSTEM CSF EXTERNAL DRAINAGE (MISCELLANEOUS) IMPLANT
TAPE PAPER 1X10 WHT MICROPORE (GAUZE/BANDAGES/DRESSINGS) ×1 IMPLANT
TAPE PAPER 2X10 WHT MICROPORE (GAUZE/BANDAGES/DRESSINGS) ×1 IMPLANT
TOWEL GREEN STERILE (TOWEL DISPOSABLE) ×1 IMPLANT
TOWEL GREEN STERILE FF (TOWEL DISPOSABLE) ×1 IMPLANT
TUBE CONNECTING 20X1/4 (TUBING) ×1 IMPLANT
TUBING FEATHERFLOW (TUBING) IMPLANT
WATER STERILE IRR 1000ML POUR (IV SOLUTION) ×1 IMPLANT

## 2022-09-19 NOTE — Consult Note (Signed)
NAME:  Marcus Wall, MRN:  [id number], DOB:  06-27-1960, LOS: 0 ADMISSION DATE:  09/19/2022, CONSULTATION DATE:  9/10 REFERRING MD:  Derry Lory- Neuro, CHIEF COMPLAINT:  post- NIR   History of Present Illness:  Marcus Wall is a 62 year old gentleman with a history of hypertension who presented after being found hanging out of bed by his friend.  He was last known well around 8 PM.  Around 1:42 he was found with vomiting, diaphoresis, making grunting noises with left gaze preference.  He was only moving his left side at this point.  Imaging in the emergency department demonstrated hyperdense left MCA.  CTA demonstrated left ICA occlusion.  He was outside the TNK window.  He underwent neurointerventional thrombectomy of left ICA, left MCA, and left ACA achieving TICI 3 flow.  He had an acutely ulcerated plaque at the left proximal ICA with 60 to 65% stenosis.  No stents.  Imaging postintervention did not demonstrate any hemorrhage or mass effect.  Right femoral access site with Angio-Seal closure.  Pertinent  Medical History  Hypertension  Significant Hospital Events: Including procedures, antibiotic start and stop dates in addition to other pertinent events   9/10 admitted, neurointerventional thrombectomy of left ICA, MCA, ACA  Interim History / Subjective:  On PRVC, breathing comfortably. Friends at bedside, they have been trying to contact family. No acute concerns.   Objective   Blood pressure 119/70, pulse 67, temperature (!) 94.5 F (34.7 C), resp. rate 20, height 6\' 3"  (1.905 m), weight (!) 148.8 kg, SpO2 98%.    Vent Mode: PRVC FiO2 (%):  [60 %-100 %] 60 % Set Rate:  [20 bmp] 20 bmp Vt Set:  [600 mL] 600 mL PEEP:  [5 cmH20] 5 cmH20 Plateau Pressure:  [16 cmH20-18 cmH20] 16 cmH20   Intake/Output Summary (Last 24 hours) at 09/19/2022 0814 Last data filed at 09/19/2022 0700 Gross per 24 hour  Intake 1401.42 ml  Output 1550 ml  Net -148.58 ml   Filed Weights   09/19/22  0200 09/19/22 0223  Weight: (!) 148.8 kg (!) 148.8 kg   Examination: General: Critically ill-appearing man lying in bed intubated, heavily sedated on propofol HENT: [place]/AT, eyes anicteric. Pupils equal, small, reactive to light. Lungs: Breathing comfortably on mechanical ventilation, CTAB Cardiovascular: S1-S2, regular rate and rhythm Abdomen: Soft, nontender, nondistended Extremities: No peripheral edema.   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  - Continue vent support, plan to extubate tomorrow after MRI   Right upper lobe atelectasis vs aspiration Abnormal chest x-ray with widened mediastinum. O2 sats within normal range.  - Aggressive chest PT  - Considered bronchoscopy but will defer, prioritize MRI brain and neuro management   Hypertension  SBP goal 120-160.  - Off Clevidipine now - Discontinue NS  - Add PRN Hydralazine 10 mg q6h for SBP >160 or DBP >110   Type 2 Diabetes Mellitus  A1c 6.7  - SSI  - Check BG q4h   Nutrition  Initiate tube feeding.  - Prosource 60 mL daily  -  Pivot 1.5 40 mL/hr  - Consult to RD  Hypocalcemia  - Replete with IV 2 g calcium gluconate   Best Practice (right click and "Reselect all SmartList Selections" daily)   Diet/type: NPO DVT prophylaxis: LMWH GI prophylaxis: H2B Lines: Arterial Line Foley:  Yes, and it is still needed Code Status:  full code Last date of multidisciplinary goals of care discussion [friends at bedside, updated. Requested contact information for family so we can try to contact  them]  Labs   CBC: Recent Labs  Lab 09/19/22 0245 09/19/22 0251 09/19/22 0529  WBC  --  6.1 5.8  NEUTROABS  --  2.1 4.5  HGB 15.0 15.0 14.8  HCT 44.0 45.3 45.1  MCV  --  85.5 84.3  PLT  --  PLATELET CLUMPS NOTED ON SMEAR, UNABLE TO ESTIMATE 195    Basic Metabolic Panel: Recent Labs  Lab 09/19/22 0245 09/19/22 0529  NA 136 135  K 6.0* 4.2  CL 102 104  CO2  --  24  GLUCOSE 188* 143*  BUN 25* 14  CREATININE 1.20 1.01  CALCIUM  --  8.7*   GFR: Estimated Creatinine Clearance: 119.7 mL/min (by C-G formula based on SCr of 1.01 mg/dL). Recent Labs  Lab 09/19/22 0245 09/19/22 0251 09/19/22 0529  WBC  --  6.1 5.8  LATICACIDVEN 2.4*  --   --     Liver Function Tests: Recent Labs  Lab 09/19/22 0529  AST 26  ALT 36  ALKPHOS 68  BILITOT 0.3  PROT 7.3  ALBUMIN 3.7   No results for input(s): "LIPASE", "AMYLASE" in the last 168 hours. No results for input(s): "AMMONIA" in the last 168 hours.  ABG    Component Value Date/Time   TCO2 26 09/19/2022 0245     Coagulation Profile: Recent Labs  Lab 09/19/22 0529  INR 1.0    Cardiac Enzymes: No results for input(s): "CKTOTAL", "CKMB", "CKMBINDEX", "TROPONINI" in the last 168 hours.  HbA1C: Hgb A1c MFr Bld  Date/Time Value Ref Range Status  09/19/2022 05:29 AM 6.7 (H) 4.8 - 5.6 % Final    Comment:    (NOTE) Pre diabetes:          5.7%-6.4%  Diabetes:              >6.4%  Glycemic control for   <7.0% adults with diabetes   03/10/2010 03:00 AM (H) <5.7 % Final   6.1 (NOTE)                                                                       According to the ADA Clinical Practice Recommendations for 2011, when HbA1c is used as a screening test:   >=6.5%   Diagnostic of Diabetes Mellitus           (if abnormal result  is confirmed)  5.7-6.4%   Increased risk of developing Diabetes Mellitus  References:Diagnosis and Classification of Diabetes Mellitus,Diabetes Care,2011,34(Suppl 1):S62-S69 and Standards of  Medical Care in         Diabetes - 2011,Diabetes Care,2011,34  (Suppl 1):S11-S61.    CBG: Recent Labs  Lab 09/19/22 0240  GLUCAP 182*    Review of Systems:   Unable to be obtained due to mental status  Past Medical History:  He,  has a past medical history of Hypertension.   Surgical History:   Past Surgical History:  Procedure Laterality Date   BACK SURGERY     KNEE ARTHROCENTESIS       Social History:   reports that he has been smoking cigars. He has never used smokeless tobacco. He reports that he does not drink alcohol and does not use drugs.   Family History:  His family history includes Alzheimer's disease in his mother; Diabetes in his father; Hypertension in his mother.   Allergies No Known Allergies   Home Medications  Prior to Admission medications   Medication Sig Start Date End Date Taking? Authorizing Provider  methocarbamol (ROBAXIN) 500 MG tablet Take 1 tablet (500 mg total) by mouth 2 (two) times daily. 11/30/17   Aviva Kluver B, PA-C  metroNIDAZOLE (FLAGYL) 500 MG tablet Take 1 tablet (500 mg total) by mouth 2 (two) times daily. Patient not taking: Reported on 11/30/2017 04/26/16   Derwood Kaplan, MD  omeprazole (PRILOSEC) 20 MG capsule Take 1 capsule (20 mg total) by mouth daily. Patient not taking: Reported on 11/30/2017 04/26/16   Derwood Kaplan, MD  ondansetron (ZOFRAN ODT) 4 MG disintegrating tablet 4mg  ODT q4 hours prn nausea/vomit Patient not taking: Reported on 11/30/2017 06/03/15   Loren Racer, MD    Tempie Hoist, MS4

## 2022-09-19 NOTE — Transfer of Care (Signed)
Immediate Anesthesia Transfer of Care Note  Patient: Marcus Wall  Procedure(s) Performed: LEFT DECOMPRESSIVE CRANIECTOMY WITH PLACEMENT OF BONE FLAP IN ABDOMEN (Left: Head)  Patient Location: ICU  Anesthesia Type:General  Level of Consciousness: Patient remains intubated per anesthesia plan  Airway & Oxygen Therapy: Patient remains intubated per anesthesia plan  Post-op Assessment: Report given to RN and Post -op Vital signs reviewed and stable  Post vital signs: Reviewed and stable  Last Vitals:  Vitals Value Taken Time  BP 76/47 09/19/22 1832  Temp 98   Pulse 79 09/19/22 1836  Resp 20 09/19/22 1831  SpO2 97 % 09/19/22 1836  Vitals shown include unfiled device data.  Last Pain:  Vitals:   09/19/22 1200  TempSrc: Axillary         Complications: No notable events documented.

## 2022-09-19 NOTE — ED Notes (Addendum)
Pt transported to Trauma room B. RSI meds given. Pt intubated @ 0243 by Ileene Hutchinson PA. Propofol started Chest xray done. 40F OG tube placed by Norva Riffle RN.

## 2022-09-19 NOTE — Progress Notes (Signed)
An USGPIV (ultrasound guided PIV) has been placed for short-term vasopressor infusion. A correctly placed ivWatch must be used when administering Vasopressors. Should this treatment be needed beyond 24 hours, central line access should be obtained.  It will be the responsibility of the bedside nurse to follow best practice to prevent extravasations.

## 2022-09-19 NOTE — Anesthesia Postprocedure Evaluation (Signed)
Anesthesia Post Note  Patient: Marcus Wall  Procedure(s) Performed: RADIOLOGY WITH ANESTHESIA     Patient location during evaluation: SICU Anesthesia Type: General Level of consciousness: sedated Pain management: pain level controlled Vital Signs Assessment: post-procedure vital signs reviewed and stable Respiratory status: patient remains intubated per anesthesia plan Cardiovascular status: stable Postop Assessment: no apparent nausea or vomiting Anesthetic complications: no  No notable events documented.  Last Vitals:  Vitals:   09/19/22 0615 09/19/22 0630  BP: 123/84 111/77  Pulse: 72 71  Resp: 20 20  Temp: (!) 34.6 C (!) 34.6 C  SpO2: 97% 96%    Last Pain:  Vitals:   09/19/22 0600  TempSrc: Axillary                 Kennieth Rad

## 2022-09-19 NOTE — Consult Note (Signed)
CC: ICA stroke  HPI:     Patient is a 62 y.o. male w/ sig PMH of HTN who presented to ED this AM after friend found him hanging out of bed and minimally responsive. Transported to ED via EMS w/ profound R sided deficits and neglect. CT/CTA showed L ICA occlusion and underwent thrombectomy.    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   No Known Allergies  Social History   Tobacco Use   Smoking status: Some Days    Types: Cigars   Smokeless tobacco: Never  Substance Use Topics   Alcohol use: No    Family History  Problem Relation Age of Onset   Hypertension Mother    Alzheimer's disease Mother    Diabetes Father     Objective:   Patient Vitals for the past 8 hrs:  BP Temp Temp src Pulse Resp SpO2 Height  09/19/22 1200 -- 98.6 F (37 C) Axillary -- -- -- --  09/19/22 0930 101/72 (!) 97.2 F (36.2 C) -- 82 20 99 % --  09/19/22 0915 103/75 (!) 96.8 F (36 C) -- 85 20 96 % --  09/19/22 0900 111/79 (!) 96.6 F (35.9 C) -- 83 18 98 % --  09/19/22 0845 109/73 (!) 96.4 F (35.8 C) -- 81 20 98 % --  09/19/22 0830 109/73 (!) 96.1 F (35.6 C) -- 80 20 96 % --  09/19/22 0815 115/81 (!) 95.9 F (35.5 C) Axillary 80 20 99 % --  09/19/22 0800 107/72 (!) 95.5 F (35.3 C) -- 79 20 98 % --  09/19/22 0745 97/73 (!) 95.2 F (35.1 C) -- 74 20 97 % --  09/19/22 0742 119/70 -- -- 67 20 98 % --  09/19/22 0730 106/81 (!) 95 F (35 C) -- 70 20 97 % --  09/19/22 0715 102/75 (!) 94.6 F (34.8 C) -- 71 20 97 % --  09/19/22 0700 109/72 (!) 94.5 F (34.7 C) -- 69 20 97 % --  09/19/22 0645 109/72 (!) 94.3 F (34.6 C) -- 71 20 97 % --  09/19/22 0630 111/77 (!) 94.3 F (34.6 C) -- 71 20  96 % --  09/19/22 0615 123/84 (!) 94.3 F (34.6 C) -- 72 20 97 % --  09/19/22 0600 120/79 (!) 94.3 F (34.6 C) Axillary 74 (!) 23 97 % --  09/19/22 0545 127/79 (!) 94.3 F (34.6 C) -- 78 20 96 % --  09/19/22 0530 121/82 (!) 94.5 F (34.7 C) -- 76 15 97 % --  09/19/22 0519 (!) 128/96 (!) 94.6 F (34.8 C) -- 87 (!) 22 98 % --  09/19/22 0515 -- (!) 94.8 F (34.9 C) -- 87 18 99 % --  09/19/22 0505 -- -- -- 86 19 100 % 6\' 3"  (1.905 m)  09/19/22 0503 (!) 132/94 -- -- 82 16 100 % --   I/O last 3 completed shifts: In: 1401.4 [I.V.:1341.7; IV Piggyback:59.7] Out: 1550 [Urine:1500; Blood:50] Total I/O In: 90.6 [I.V.:90.6] Out: -   Intubated, sedated PERRLA Corneal reflex intact bilaterally Positive cough/gag Spontaneous movement LUE/LLE No localization to pain   Assessment:   This  is a 62 yo male with an acutely occluded L ICA s/p thrombectomy with resultant large L hemispheric ischemic stroke involving the MCA and ACA territories.    Plan:   -Spoke w/ patient's family (son, Ray). They wish to pursue all treatment options. Discussed option of L hemicraniectomy in anticipation of cerebral edema, for which patient is at high risk given volume/location of ischemic stroke. Discussed procedure in detail as well as risks, benefits, alternatives. They wish to proceed. Plan for urgent L craniectomy this afternoon. Call w/ questions/concerns.   Gabrianna Fassnacht Margaree Mackintosh, PA-C

## 2022-09-19 NOTE — Op Note (Signed)
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 09/19/2022    Surgeon(s) and Role:    Maisie Fus, Coy Saunas, MD - Primary  Indications: This is a 62 year old man who suffered a large left ICA infarct involving most of his left hemisphere.  He had a thrombectomy procedure without benefit.  As he was developing malignant cerebral edema with high risk of herniation, I discussed with the patient's son the option of decompressive hemicraniectomy.  This would potentially improve his chance of surviving this large hemispheric stroke but would not provide any benefit in terms of functional gains, and prognosis for meaningful functional recovery would likely remain poor.  He wished to proceed with surgery.  Risks, benefits, alternatives, and expected convalescence were discussed with the son.  Risks discussed included but were not limited to bleeding, pain, infection, seizure, stroke, scar, subdural recurrence, neurologic deficit, coma, and death.  Informed consent was obtained.  Surgeon: Marcus Wall   Assistants: Marcus Ranks PA.  She provided critical assistance through the entirety of the case.  No qualified trainees were available to assist with the procedure.  Anesthesia: General endotracheal anesthesia   Procedure in detail:  The patient was brought to the operating room.  A timeout was performed.  General anesthesia was induced and patient was intubated by the anesthesia service.  After appropriate lines and monitors were placed, patient's head was turned to the right and scalp was shaved, preprepped with alcohol and cleansing solution and the scalp and abdomen was prepped and draped in sterile fashion.  1% lidocaine with epinephrine injected into the planned incision.  A question mark shaped incision was made with a 10 blade and monopolar electrocautery  was used to open the galea and incised the temporalis fascia.  The periosteum and temporalis muscle was dissected off the skull and the myocutaneous flap was flapped forward and held in place with fishhooks.  Bur holes were placed at the coronal suture, keyhole, squamosal temporal bone, and parietal bone were used to dissect the dura from the inner table of the skull.  Craniotome was used to perform a craniotomy.  Meticulous epidural hemostasis obtained.  The middle meningeal artery was identified and coagulated.  Additional bone was removed for temporal decompression using Leksell rongeur.  The dura was noted to be very tense.  The dura was opened in cruciate manner.  The left frontal lobe was significantly red and swollen indicating hemorrhagic transformation of the stroke.  The rest of the hemisphere was noted to have blanched cortex but hyperemia adjacent to the veins likely from revascularization of infarcted brain.  The wound was irrigated thoroughly.  DuraGen was placed as a inlay and the dural flaps placed over it.  Small piece of dura guard was placed under the temporalis muscle.  A medium Hemovac drain was placed in subgaleal space and tunneled the skin secured with a stitch.   The galea was closed with 2-0 Vicryl sutures in buried fashion.  Skin was closed with staples.  Incision was made in the left subcostal area of the abdomen and a subcutaneous pocket was made for the patient's bone flap.  This was placed in the subcutaneous space.  The subcutaneous fat was closed with 2-0 Vicryl stitches.  The skin was closed with 2-0 Vicryl stitches followed by staples.  Sterile dressings was then placed on the incisions.  All counts were correct at the  end of surgery.  No complications were noted.  Findings: Hemorrhagic transformation of left frontal lobe with significant swelling, moderate brain swelling elsewhere with hyperemia evident, blanched cortex  Estimated Blood Loss:  200 ml         Drains:  HEMOVAC        Specimens: None         Implants: none        Complications:  * No complications entered in OR log *         Disposition: PACU - hemodynamically stable.         Condition: stable

## 2022-09-19 NOTE — Progress Notes (Addendum)
eLink Physician-Brief Progress Note Patient Name: Marcus Wall DOB: 07-Apr-1960 MRN: [id number]   Date of Service  09/19/2022  HPI/Events of Note  62 y.o. male with PMH significant for HTN who presents as code stroke with R sided weakness, L gaze preference, vomiting, R hemianopsia who presented with stroke secondary to left ICA terminus occlusion status post complete revascularization of left ICA, left MCA and left ACA with tici 3 revascularization.  Patient initially presented hypertensive into the 200s, was briefly on phenylephrine, but now normotensive without intervention.  On continuous propofol mechanically ventilated with endotracheal tube.  Laboratory studies consistent with hypokalemia, mild lactic acidosis and normal blood counts.  Chest radiograph with endotracheal tube in appropriate positioning, enteric tube not well-visualized, widened mediastinum.  CT head with previously noted arterial defects.   eICU Interventions  Propofol and fentanyl as needed, and daily spontaneous awakening and breathing trial.  SBP goal 120-160, clevidipine as needed, may need phenylephrine if persistently hypotensive.  GI prophylaxis with pantoprazole DVT prophylaxis ordered with enoxaparin   0600 add bilateral wrist restraints in the setting of agitation.  3474 -persistent widened mediastinum.  Last CT of the chest in 2012 without evidence of mediastinal abnormalities in the anterior, middle, or posterior compartments.  No other imaging available to review.  May be beneficial to obtain a CT chest pending neurological recovery.  Intervention Category Evaluation Type: New Patient Evaluation  Saniya Tranchina 09/19/2022, 5:10 AM

## 2022-09-19 NOTE — Progress Notes (Signed)
Pt not in room at 11:00 AM.

## 2022-09-19 NOTE — ED Triage Notes (Signed)
Pt BIBEMS as a code stroke. LKW 09/18/22 @ 20:00. Pt pick up at hotel, as per report male friend woke up around 01:42 to find pt diaphoretic, drooling w/ Rt side deficit. EMS [phone]

## 2022-09-19 NOTE — Anesthesia Procedure Notes (Signed)
Arterial Line Insertion Start/End9/10/2022 3:33 AM Performed by: Tressia Miners, CRNA  Patient location: OOR procedure area. Emergency situation Left, radial was placed Catheter size: 20 G Hand hygiene performed , maximum sterile barriers used  and Seldinger technique used Allen's test indicative of satisfactory collateral circulation Attempts: 1 Procedure performed without using ultrasound guided technique. Following insertion, dressing applied and Biopatch. Post procedure assessment: normal and unchanged  Patient tolerated the procedure well with no immediate complications.

## 2022-09-19 NOTE — Progress Notes (Signed)
Exam paused due to central line placement per np.

## 2022-09-19 NOTE — Progress Notes (Addendum)
STROKE TEAM PROGRESS NOTE   BRIEF HPI Mr. Arvine Rodeman is a 62 y.o. male with history of hypertension who presented as a code stroke due to right-sided weakness, left gaze preference, vomiting, and right hemianopsia.  Patient's last known well was 8 PM 9/9.  He was then found around 1:42 AM.  Friend states he saw him hanging off the bed, making noises, vomiting, moving only his left side.  Per EMS entrance port patient's heart rate would drop to 30s and required intermittent pacing.  He was also hypertensive up to the 200s systolic. CT head with hyperdense left MCA with developing left MCA stroke.  CTA with occlusion of the left ICA at bifurcation, occluded all the way to left MCA.  SIGNIFICANT HOSPITAL EVENTS 9/9: Taken for thrombectomy of L ACA A1, MCA Mi with TICI 3 revascularization  INTERIM HISTORY/SUBJECTIVE Patient remains sedated and intubated.  Unresponsive.  Aphasic and not following commands.  He has semipurposeful movements on the left and trace withdrawal in the right leg. MRI shows acute infarct affecting the left frontal cortical, superior left temporal and left parietal with infarction of the left basal ganglia, mild swelling, left-to-right shift of 2 mm.  250 mL of 3% hypertonic bolus given followed by 75 mL an hour continuous infusion Neurosurgery consulted, patient taken for urgent left Hemicraniectomy due to anticipation of cerebral edema.   OBJECTIVE  CBC    Component Value Date/Time   WBC 5.8 09/19/2022 0529   RBC 5.35 09/19/2022 0529   HGB 14.8 09/19/2022 0529   HCT 45.1 09/19/2022 0529   PLT 195 09/19/2022 0529   MCV 84.3 09/19/2022 0529   MCH 27.7 09/19/2022 0529   MCHC 32.8 09/19/2022 0529   RDW 13.2 09/19/2022 0529   LYMPHSABS 1.0 09/19/2022 0529   MONOABS 0.3 09/19/2022 0529   EOSABS 0.0 09/19/2022 0529   BASOSABS 0.0 09/19/2022 0529    BMET    Component Value Date/Time   NA 138 09/19/2022 1154   K 4.2 09/19/2022 0529   CL 104 09/19/2022  0529   CO2 24 09/19/2022 0529   GLUCOSE 143 (H) 09/19/2022 0529   BUN 14 09/19/2022 0529   CREATININE 1.01 09/19/2022 0529   CALCIUM 8.7 (L) 09/19/2022 0529   GFRNONAA >60 09/19/2022 0529    IMAGING past 24 hours MR BRAIN WO CONTRAST  Result Date: 09/19/2022 CLINICAL DATA:  Neuro deficit, acute, stroke suspected. Left MCA stroke status post intervention. EXAM: MRI HEAD WITHOUT CONTRAST TECHNIQUE: Multiplanar, multiecho pulse sequences of the brain and surrounding structures were obtained without intravenous contrast. COMPARISON:  CT studies earlier same day FINDINGS: Brain: The brainstem and cerebellum are normal. The right cerebral hemisphere is normal. There is acute infarction affecting the left frontal cortical brain, superior left temporal lobe, and left parietal lobe. Left basal ganglia are also affected. The infarcted brain shows some swelling with left-to-right shift of 2 mm. Very minimal petechial blood products evident in the distal left ACA territory. No measurable hematoma. No hydrocephalus. No extra-axial collection. Vascular: Flow present within the major vessels at the base of the brain currently. Skull and upper cervical spine: Negative Sinuses/Orbits: Clear/normal Other: None IMPRESSION: Acute infarction affecting the left frontal cortical brain, superior left temporal lobe and left parietal lobe. Infarction of the left basal ganglia. Mild swelling with left-to-right shift of 2 mm. Minimal petechial blood products in the distal left ACA territory. No measurable hematoma. Electronically Signed   By: Paulina Fusi M.D.   On: 09/19/2022 15:34  DG Abd 1 View  Result Date: 09/19/2022 CLINICAL DATA:  Orogastric tube placement. EXAM: ABDOMEN - 1 VIEW COMPARISON:  None Available. FINDINGS: Distal tip of nasogastric tube is seen in expected position of proximal stomach. IMPRESSION: Distal tip of nasogastric tube is seen in expected position of proximal stomach. Electronically Signed   By:  Lupita Raider M.D.   On: 09/19/2022 15:31   DG CHEST PORT 1 VIEW  Result Date: 09/19/2022 CLINICAL DATA:  Central line placement. EXAM: PORTABLE CHEST 1 VIEW COMPARISON:  Same day. FINDINGS: Normal cardiac size. Endotracheal and nasogastric tubes are in grossly good position. Interval placement of right internal jugular catheter with distal tip in expected position of SVC. Right upper lobe airspace opacity is noted concerning for pneumonia or atelectasis. Left lung is clear. IMPRESSION: Interval placement of right internal jugular catheter with distal tip in expected position of SVC. Continued right upper lobe airspace opacity consistent with pneumonia or atelectasis. Electronically Signed   By: Lupita Raider M.D.   On: 09/19/2022 15:30   VAS US CAROTID  Result Date: 09/19/2022 Carotid Arterial Duplex Study Patient Name:  TERRANCE SOTOMAYOR [facility]  Date of Exam:   09/19/2022 Medical Rec #: [id number]               Accession #:    [id number] Date of Birth: Dec 09, 1960               Patient Gender: M Patient Age:   39 years Exam Location:  [facility] Procedure:      VAS US CAROTID Referring Phys: Terrilee Files [facility] --------------------------------------------------------------------------------  Indications:       CVA. Risk Factors:      Hypertension. Limitations        Today's exam was limited due to a central line. Right side w/                    bandaging. Comparison Study:  No prior studies. Performing Technologist: Jean Rosenthal RDMS, RVT  Examination Guidelines: A complete evaluation includes B-mode imaging, spectral Doppler, color Doppler, and power Doppler as needed of all accessible portions of each vessel. Bilateral testing is considered an integral part of a complete examination. Limited examinations for reoccurring indications may be performed as noted.  Right Carotid Findings: +----------+--------+--------+--------+------------------+---------------------+           PSV cm/sEDV  cm/sStenosisPlaque DescriptionComments              +----------+--------+--------+--------+------------------+---------------------+ CCA Prox                                            Not visualized- see                                                       limitations           +----------+--------+--------+--------+------------------+---------------------+ CCA Distal56      10                                intimal thickening    +----------+--------+--------+--------+------------------+---------------------+ ICA Prox  33      9                                                       +----------+--------+--------+--------+------------------+---------------------+  ICA Distal38      10                                                      +----------+--------+--------+--------+------------------+---------------------+ ECA       51      11                                                      +----------+--------+--------+--------+------------------+---------------------+ +----------+--------+-------+----------------+-------------------+           PSV cm/sEDV cmsDescribe        Arm Pressure (mmHG) +----------+--------+-------+----------------+-------------------+ [id number]            Multiphasic, WNL                    +----------+--------+-------+----------------+-------------------+ +---------+--------+--+--------+-+---------+ VertebralPSV cm/s25EDV cm/s5Antegrade +---------+--------+--+--------+-+---------+  Left Carotid Findings: +----------+--------+--------+--------+------------------+------------------+           PSV cm/sEDV cm/sStenosisPlaque DescriptionComments           +----------+--------+--------+--------+------------------+------------------+ CCA Prox  152     24                                                   +----------+--------+--------+--------+------------------+------------------+ CCA Distal43      12                                 intimal thickening +----------+--------+--------+--------+------------------+------------------+ ICA Prox  60      20      1-39%   heterogenous mild                    +----------+--------+--------+--------+------------------+------------------+ ICA Distal69      29                                                   +----------+--------+--------+--------+------------------+------------------+ ECA       47      9                                                    +----------+--------+--------+--------+------------------+------------------+ +----------+--------+--------+----------------+-------------------+           PSV cm/sEDV cm/sDescribe        Arm Pressure (mmHG) +----------+--------+--------+----------------+-------------------+ [id number]             Multiphasic, WNL                    +----------+--------+--------+----------------+-------------------+ +---------+--------+--+--------+--+---------+ VertebralPSV cm/s53EDV cm/s12Antegrade +---------+--------+--+--------+--+---------+   Summary: Right Carotid: The extracranial vessels were near-normal with only minimal wall                thickening or plaque. Left Carotid: Velocities in the left ICA are consistent with a  1-39% stenosis. Vertebrals:  Bilateral vertebral arteries demonstrate antegrade flow. Subclavians: Normal flow hemodynamics were seen in bilateral subclavian              arteries. *See table(s) above for measurements and observations.     Preliminary    DG CHEST PORT 1 VIEW  Result Date: 09/19/2022 CLINICAL DATA:  61 year old male left MCA stroke.  Intubated. EXAM: PORTABLE CHEST 1 VIEW COMPARISON:  Portable chest 09/19/2022 and earlier. FINDINGS: Portable AP semi upright view at 0541 hours. Endotracheal tube position not significantly changed, at the lower clavicles now. Interval right upper lung volume loss and dense opacification, likely upper lobe collapse. Some air  bronchograms there. Enteric tube looped in the left upper quadrant. Stable cardiac size and mediastinal contours. Lung ventilation elsewhere within normal limits. No pneumothorax or pleural effusion identified. No acute osseous abnormality identified. Negative visible bowel gas. IMPRESSION: 1.  Stable lines and tubes. 2. But new right upper lobe collapse since 0236 hours. Recommend pulmonary toilet and repeat portable chest x-ray. These results will be called to the ordering clinician or representative by the Radiologist Assistant, and communication documented in the PACS or [employer]. Electronically Signed   By: Odessa Fleming M.D.   On: 09/19/2022 06:37   DG Chest Portable 1 View  Result Date: 09/19/2022 CLINICAL DATA:  Endotracheal tube placement. EXAM: PORTABLE CHEST 1 VIEW COMPARISON:  05/02/2017. FINDINGS: The heart size and mediastinal contours are within normal limits. Lung volumes are low. No consolidation, effusion, or pneumothorax. An endotracheal tube terminates 3.6 cm above the carina. An enteric tube is seen over the proximal to mid esophagus and not visualized distal to this point. No acute osseous abnormality. IMPRESSION: 1. No active disease. 2. Endotracheal tube terminates 3.6 cm above the carina. 3. Enteric tube is seen over the proximal and mid esophagus and is not visualized distal to this point. Additional views are recommended. Electronically Signed   By: Thornell Sartorius M.D.   On: 09/19/2022 04:26   CT CEREBRAL PERFUSION W CONTRAST  Result Date: 09/19/2022 CLINICAL DATA:  Left MCA stroke EXAM: CT PERFUSION BRAIN TECHNIQUE: Multiphase CT imaging of the brain was performed following IV bolus contrast injection. Subsequent parametric perfusion maps were calculated using RAPID software. RADIATION DOSE REDUCTION: This exam was performed according to the departmental dose-optimization program which includes automated exposure control, adjustment of the mA and/or kV according to patient size  and/or use of iterative reconstruction technique. CONTRAST:  45mL OMNIPAQUE IOHEXOL 350 MG/ML SOLN COMPARISON:  CTA head neck same day FINDINGS: CT Brain Perfusion Findings: CBF (<30%) Volume: Perfusion (Tmax>6.0s) volume: Mismatch Volume: 86mL ASPECTS on noncontrast CT Head: 8 at 2:28 a.m. today. Infarct Core: 236 mL is reported, which includes nearly the entire left anterior and middle cerebral artery territories. On further review of the CTA images, there is diminished opacification in the distal A2 segment of the left anterior cerebral artery (series 11, image 38, 39 of that examination). Mismatch location:Predominantly within the posterior left temporal lobe. Technical notes: Mild translational motion during the examination. IMPRESSION: 1. A 236 mL core infarct involving nearly the entire left anterior and middle cerebral artery territories. 2. An 86 mL calculated penumbra, predominantly within the posterior left temporal lobe. 3. On further review of the CTA images, there is diminished opacification in the distal A2 segment of the left anterior cerebral artery, though it is widely patent proximally. These results were communicated to Dr. Erick Blinks at 3:23 am on 09/19/2022 by  text page via the [facility] messaging system. Electronically Signed   By: Deatra Robinson M.D.   On: 09/19/2022 03:23   CT ANGIO HEAD NECK W WO CM (CODE STROKE)  Result Date: 09/19/2022 CLINICAL DATA:  Weakness EXAM: CT ANGIOGRAPHY HEAD AND NECK WITH AND WITHOUT CONTRAST TECHNIQUE: Multidetector CT imaging of the head and neck was performed using the standard protocol during bolus administration of intravenous contrast. Multiplanar CT image reconstructions and MIPs were obtained to evaluate the vascular anatomy. Carotid stenosis measurements (when applicable) are obtained utilizing NASCET criteria, using the distal internal carotid diameter as the denominator. RADIATION DOSE REDUCTION: This exam was performed according to  the departmental dose-optimization program which includes automated exposure control, adjustment of the mA and/or kV according to patient size and/or use of iterative reconstruction technique. CONTRAST:  75mL OMNIPAQUE IOHEXOL 350 MG/ML SOLN COMPARISON:  None Available. FINDINGS: CTA NECK FINDINGS SKELETON: There is no bony spinal canal stenosis. No lytic or blastic lesion. OTHER NECK: Normal pharynx, larynx and major salivary glands. No cervical lymphadenopathy. Unremarkable thyroid gland. UPPER CHEST: No pneumothorax or pleural effusion. No nodules or masses. AORTIC ARCH: There is no calcific atherosclerosis of the aortic arch. Conventional 3 vessel aortic branching pattern. RIGHT CAROTID SYSTEM: Normal without aneurysm, dissection or stenosis. LEFT CAROTID SYSTEM: Left common carotid artery is patent and of normal caliber. The left ICA is occluded at its origin and remains occluded along its entire course. VERTEBRAL ARTERIES: Left dominant configuration.There is short segment loss of opacification of the right V2 segment at the level of the C3 vertebra (series 5, image 211). This may be artifactual, as the remainder of the right vertebral artery is entirely normal. CTA HEAD FINDINGS POSTERIOR CIRCULATION: --Vertebral arteries: Normal V4 segments. --Inferior cerebellar arteries: Normal. --Basilar artery: Normal. --Superior cerebellar arteries: Normal. --Posterior cerebral arteries (PCA): Normal. Both posterior communicating arteries are patent. ANTERIOR CIRCULATION: --Intracranial internal carotid arteries: Normal. --Anterior cerebral arteries (ACA): The left A1 segment is non-opacified, but otherwise the anterior cerebral arteries are normal. --Middle cerebral arteries (MCA): The left MCA is completely occluded with essentially no collateral blood flow demonstrated in the MCA territory. The right MCA is normal. VENOUS SINUSES: As permitted by contrast timing, patent. Incidental areas of fat attenuation of the  inter hemispheric fissure ANATOMIC VARIANTS: None Review of the MIP images confirms the above findings. IMPRESSION: 1. 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 2. Complete occlusion of the left MCA with essentially no collateral blood flow in the MCA territory. 3. Short segment loss of opacification of the right V2 segment at the level of the C3 vertebra, which may be artifactual, as the remainder of the right vertebral artery is entirely normal. Critical Value/emergent results were called by telephone at the time of interpretation on 09/19/2022 at 2:45 am to provider [facility] , who verbally acknowledged these results. Electronically Signed   By: Deatra Robinson M.D.   On: 09/19/2022 02:50   CT HEAD CODE STROKE WO CONTRAST  Result Date: 09/19/2022 CLINICAL DATA:  Code stroke.  Weakness EXAM: CT HEAD WITHOUT CONTRAST TECHNIQUE: Contiguous axial images were obtained from the base of the skull through the vertex without intravenous contrast. RADIATION DOSE REDUCTION: This exam was performed according to the departmental dose-optimization program which includes automated exposure control, adjustment of the mA and/or kV according to patient size and/or use of iterative reconstruction technique. COMPARISON:  None Available. FINDINGS: Brain: There is no mass, hemorrhage or extra-axial collection.  The size and configuration of the ventricles and extra-axial CSF spaces are normal. The brain parenchyma is normal, without evidence of acute or chronic infarction. Vascular: Hyperdense left MCA M1 segment Skull: The visualized skull base, calvarium and extracranial soft tissues are normal. Sinuses/Orbits: No fluid levels or advanced mucosal thickening of the visualized paranasal sinuses. No mastoid or middle ear effusion. The orbits are normal. ASPECTS (Alberta Stroke Program Early CT Score) - Ganglionic level infarction (caudate, lentiform nuclei, internal capsule, insula, M1-M3  cortex): 5 (abnormal insula and left lentiform nucleus) - Supraganglionic infarction (M4-M6 cortex): 3 Total score (0-10 with 10 being normal): 8 IMPRESSION: 1. No intracranial hemorrhage. 2. Hyperdense left MCA M1 segment. 3. ASPECTS is 8. Dr. Erick Blinks at 2:31 am on 09/19/2022 by text page via the [facility] messaging system. Electronically Signed   By: Deatra Robinson M.D.   On: 09/19/2022 02:40    Vitals:   09/19/22 1500 09/19/22 1515 09/19/22 1530 09/19/22 1545  BP: 122/80 124/78 115/76 120/73  Pulse: 78 76 75 86  Resp: 20 20 20 20   Temp:      TempSrc:      SpO2: 99% 99% 99% 97%  Weight:      Height:        PHYSICAL EXAM General: Critically ill middle-aged African-American male intubated and sedated CV: Regular rate and rhythm on monitor Respiratory: Mechanically ventilated GI: Abdomen soft and nontender  NEURO:  Patient intubated and sedated.  Eyes are closed.  Unresponsive.  Aphasic and not following commands.  Pupils 2 and sluggish.  Does not open eyes to voice or pain. Withdraws to pain in left arm and bilateral lower extremities.  No withdrawal seen in right arm.  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  S/p mechanical thrombectomy with TICI 3 revaularization  Code Stroke CT head  No intracranial hemorrhage Hyperdense left MCA M1 segment Aspects is 8 CTA head & neck w/perfusion Complete occlusion of the left ICA Complete occlusion of the left MCA with essentially no collateral blood flow CT perfusion  236 mm core infarct involving nearly the entire left ACA and MCA territories 86 mL calculated penumbra Post IR CT: No hemorrhage MRI   Acute infarction affecting the left frontal cortical, superior left temporal and left parietal lobe. Infarction of the left basal ganglia Mild swelling with left-to-right shift of 2 mm Minimal petechial blood products in distal left ACA territory  2D  Echo PENDING  LDL 109 HgbA1c 6.7 VTE prophylaxis - SCDs No antithrombotic prior to admission, now on No antithrombotic pending MRI, then held due to surgery Therapy recommendations:  pending Disposition:  pending  Cerebral Edema 250 mL bolus of 3% given Continuous 3% 75 mL an hour Sodium goal 150-155 Q6 NA checks Na 135 -> 138 Neurosurgery consult Taken for urgent left hemicraniectomy  Acute respiratory failure  Post procedure Vent management CCM management, appreciate assistance No SBT indication today Continue full vent support VAP bundle  Hypertensive Emergency Home meds:  none Hypertensive into the 200s with EMS and on arrival  Cleviprex gtt, wean as able IVP PRNs added to assist with BP control Unstable Blood Pressure Goal: SBP between 130-150 for 24 hours and then less than 160   Hyperlipidemia Home meds:  none LDL 109, goal < 70 Add Lipitor 80mg   Continue statin at discharge  Diabetes type II, prediabetic Home meds: None HgbA1c 6.7, goal < 7.0 CBGs  SSI Recommend close follow-up with PCP  Tobacco Abuse Patient smokes cigars  Dysphagia Patient has post-stroke dysphagia, SLP consulted    Diet   Diet NPO time specified   Advance diet as tolerated  Other Stroke Risk Factors Obesity, Body mass index is 41 kg/m., BMI >/= 30 associated with increased stroke risk, recommend weight loss, diet and exercise as appropriate   Other Active Problems Hypocalcemia Replete, continue to monitor  Hospital day # 0  I have personally obtained history,examined this patient, reviewed notes, independently viewed imaging studies, participated in medical decision making and plan of care.ROS completed by me personally and pertinent positives fully documented  I have made any additions or clarifications directly to the above note. Agree with note above.  Patient presented with aphasia and right hemiparesis outside time window for thrombolysis with terminal left ICA  occlusion and underwent emergent mechanical thrombectomy successfully however baseline CT perfusion showed a very large core of 236 cc with 86 cc calculated penumbra.  Follow-up MRI scan shows a fairly large left hemispheric infarct involving ACA and MCA territories with cytotoxic edema and midline shift.  Patient is at significant risk for malignant cerebral edema and imminent death from brain herniation.  I had a long discussion with patient's son Jillyn Hidden at the bedside and explained the patient's poor prognosis and significant neurological disability even if he were to survive.  He will need prolonged life support and likely emergent hemicraniectomy which will save his life but not necessarily the profound neurological disability that he is going to have which will likely lead to prolonged hospital stay, tracheostomy, PEG tube and nursing home care and likely survival being dependent on other caregivers.  He understands this and wants full medical and neurosurgical support.  I have consulted Dr. Hoyt Koch neurosurgery for emergent hemicraniectomy.  Plan to bolus 250 cc of 3% saline bolus followed by 75 cc an hour.  Keep sodium 150-155 range.  Discussed with Dr.Ellison critical care medicine. This patient is critically ill and at significant risk of neurological worsening, death and care requires constant monitoring of vital signs, hemodynamics,respiratory and cardiac monitoring, extensive review of multiple databases, frequent neurological assessment, discussion with family, other specialists and medical decision making of high complexity.I have made any additions or clarifications directly to the above note.This critical care time does not reflect procedure time, or teaching time or supervisory time of PA/NP/Med Resident etc but could involve care discussion time.  I spent 60 minutes of neurocritical care time  in the care of  this patient.     Delia Heady, MD Medical Director [facility] Stroke  [facility] Pager: [phone] 09/19/2022 4:59 PM   To contact Stroke Continuity provider, please refer to [email or web address]. After hours, contact General Neurology

## 2022-09-19 NOTE — Progress Notes (Signed)
PCCM INTERVAL PROGRESS NOTE   Called to bedside to evaluate patient with cental line malfunction. Apparently in the OR, one lumen of the CVL was damaged and pulled off of the hub inadvertently prior to transfer to the ICU. Upon arrival to the ICU the CVL was missing one lumen and was bleeding. CVL will need to be discontinued.   Will ask PM team to re-assess for CVL placement    Joneen Roach, AGACNP-BC [facility]  See Amion for personal pager PCCM on call pager [phone] until 7pm. Please call Elink 7p-7a. [phone]  09/19/2022 6:52 PM

## 2022-09-19 NOTE — Code Documentation (Signed)
Stroke Response Nurse Documentation Code Documentation  Marcus Wall is a 62 y.o. male arriving to [facility]  via [facility] on 9/10 with unknown past medical hx. On No antithrombotic. Code stroke was activated by EMS.   Patient from hotel where he was LKW at 2000 and now complaining of right sided weakness and aphasia.   Stroke team at the bedside on patient arrival. Labs drawn and patient cleared for CT by Dr. Wilkie Aye. Patient to CT with team. NIHSS 27, see documentation for details and code stroke times. Patient with decreased LOC, disoriented, not following commands, left gaze preference , right hemianopia, right facial droop, right arm weakness, bilateral leg weakness, right decreased sensation, Global aphasia , and dysarthria  on exam. The following imaging was completed:  CT Head, CTA, and CTP. Patient is not a candidate for IV Thrombolytic due to outside of window. Patient is a candidate for IR due to Left ICA/MCA occlusions. Pt was taken to ED for intubation after CTA and then back to CT for perfusion prior to IR.  Care Plan: Mechanical thrombectomy in IR.   Bedside handoff with ED RN Paden.    Rose Fillers  Rapid Response RN

## 2022-09-19 NOTE — Procedures (Signed)
Central Venous Catheter Insertion Procedure Note  Marcus Wall  [id number]  [birth year]  Date:09/19/22  Time:1:57 PM   Provider Performing:Cecil Bixby Lacretia Nicks Mikey Bussing   Procedure: Insertion of Non-tunneled Central Venous [id number]) with US guidance (28315)   Indication(s) Medication administration  Consent Risks of the procedure as well as the alternatives and risks of each were explained to the patient and/or caregiver.  Consent for the procedure was obtained and is signed in the bedside chart  Anesthesia Topical only with 1% lidocaine   Timeout Verified patient identification, verified procedure, site/side was marked, verified correct patient position, special equipment/implants available, medications/allergies/relevant history reviewed, required imaging and test results available.  Sterile Technique Maximal sterile technique including full sterile barrier drape, hand hygiene, sterile gown, sterile gloves, mask, hair covering, sterile ultrasound probe cover (if used).  Procedure Description Area of catheter insertion was cleaned with chlorhexidine and draped in sterile fashion.  With real-time ultrasound guidance a central venous catheter was placed into the right internal jugular vein. Nonpulsatile blood flow and easy flushing noted in all ports.  The catheter was sutured in place and sterile dressing applied.  Complications/Tolerance None; patient tolerated the procedure well. Chest X-ray is ordered to verify placement for internal jugular or subclavian cannulation.   Chest x-ray is not ordered for femoral cannulation.  EBL Minimal  Specimen(s) None   Joneen Roach, AGACNP-BC [facility]  See Amion for personal pager PCCM on call pager [phone] until 7pm. Please call Elink 7p-7a. [phone]  09/19/2022 1:57 PM

## 2022-09-19 NOTE — Progress Notes (Signed)
Pt transported from ED TRAB to IR via ventilator with no complications.

## 2022-09-19 NOTE — ED Provider Notes (Signed)
[place] EMERGENCY DEPARTMENT AT [facility] Provider Note   CSN: [id number] Arrival date & time: 09/19/22  4098     History  No chief complaint on file.   Marcus Wall is a 62 y.o. male.  HPI     This is a 62 year old male who presents as a code stroke.  Per EMS was last seen normal at 8 PM.  A friend was awoken by him making noise at the bedside.  He would not respond.  He appeared not to be using his right side and had vomited.  She called 911.  Per EMS he has been hypertensive.  He has been aphasic with right-sided deficits and neglect.  EMS also noted that patient would intermittently go bradycardic in the 30s to 40s.  He was never hypotensive.  They intermittently paced him.  Level 5 caveat  Home Medications Prior to Admission medications   Medication Sig Start Date End Date Taking? Authorizing Provider  methocarbamol (ROBAXIN) 500 MG tablet Take 1 tablet (500 mg total) by mouth 2 (two) times daily. 11/30/17   Aviva Kluver B, PA-C  metroNIDAZOLE (FLAGYL) 500 MG tablet Take 1 tablet (500 mg total) by mouth 2 (two) times daily. Patient not taking: Reported on 11/30/2017 04/26/16   Derwood Kaplan, MD  omeprazole (PRILOSEC) 20 MG capsule Take 1 capsule (20 mg total) by mouth daily. Patient not taking: Reported on 11/30/2017 04/26/16   Derwood Kaplan, MD  ondansetron (ZOFRAN ODT) 4 MG disintegrating tablet 4mg  ODT q4 hours prn nausea/vomit Patient not taking: Reported on 11/30/2017 06/03/15   Loren Racer, MD      Allergies    Patient has no known allergies.    Review of Systems   Review of Systems  Unable to perform ROS: Mental status change    Physical Exam Updated Vital Signs Wt (!) 148.8 kg   BMI 36.03 kg/m  Physical Exam Vitals and nursing note reviewed.  Constitutional:      Appearance: He is well-developed.     Comments: Somnolent, minimally arousable, will open eyes to loud voice  HENT:     Head: Normocephalic and atraumatic.      Nose: Nose normal.     Mouth/Throat:     Mouth: Mucous membranes are moist.  Eyes:     Pupils: Pupils are equal, round, and reactive to light.     Comments: Right sided neglect  Cardiovascular:     Rate and Rhythm: Normal rate and regular rhythm.     Heart sounds: Normal heart sounds. No murmur heard. Pulmonary:     Effort: Pulmonary effort is normal. No respiratory distress.     Breath sounds: Normal breath sounds. No wheezing.  Abdominal:     Palpations: Abdomen is soft.     Tenderness: There is no abdominal tenderness. There is no guarding or rebound.  Musculoskeletal:     Cervical back: Neck supple.  Lymphadenopathy:     Cervical: No cervical adenopathy.  Skin:    General: Skin is warm and dry.  Neurological:     Comments: Unable to assess orientation, aphasic, right-sided weakness noted, right-sided neglect     ED Results / Procedures / Treatments   Labs (all labs ordered are listed, but only abnormal results are displayed) Labs Reviewed  I-STAT CHEM 8, ED - Abnormal; Notable for the following components:      Result Value   Potassium 6.0 (*)    BUN 25 (*)    Glucose, Bld 188 (*)  Calcium, Ion 1.05 (*)    All other components within normal limits  CBG MONITORING, ED - Abnormal; Notable for the following components:   Glucose-Capillary 182 (*)    All other components within normal limits  I-STAT CG4 LACTIC ACID, ED - Abnormal; Notable for the following components:   Lactic Acid, Venous 2.4 (*)    All other components within normal limits  ETHANOL  PROTIME-INR  APTT  CBC  DIFFERENTIAL  COMPREHENSIVE METABOLIC PANEL  RAPID URINE DRUG SCREEN, HOSP PERFORMED  URINALYSIS, ROUTINE W REFLEX MICROSCOPIC    EKG None  Radiology CT ANGIO HEAD NECK W WO CM (CODE STROKE)  Result Date: 09/19/2022 CLINICAL DATA:  Weakness EXAM: CT ANGIOGRAPHY HEAD AND NECK WITH AND WITHOUT CONTRAST TECHNIQUE: Multidetector CT imaging of the head and neck was performed using the  standard protocol during bolus administration of intravenous contrast. Multiplanar CT image reconstructions and MIPs were obtained to evaluate the vascular anatomy. Carotid stenosis measurements (when applicable) are obtained utilizing NASCET criteria, using the distal internal carotid diameter as the denominator. RADIATION DOSE REDUCTION: This exam was performed according to the departmental dose-optimization program which includes automated exposure control, adjustment of the mA and/or kV according to patient size and/or use of iterative reconstruction technique. CONTRAST:  75mL OMNIPAQUE IOHEXOL 350 MG/ML SOLN COMPARISON:  None Available. FINDINGS: CTA NECK FINDINGS SKELETON: There is no bony spinal canal stenosis. No lytic or blastic lesion. OTHER NECK: Normal pharynx, larynx and major salivary glands. No cervical lymphadenopathy. Unremarkable thyroid gland. UPPER CHEST: No pneumothorax or pleural effusion. No nodules or masses. AORTIC ARCH: There is no calcific atherosclerosis of the aortic arch. Conventional 3 vessel aortic branching pattern. RIGHT CAROTID SYSTEM: Normal without aneurysm, dissection or stenosis. LEFT CAROTID SYSTEM: Left common carotid artery is patent and of normal caliber. The left ICA is occluded at its origin and remains occluded along its entire course. VERTEBRAL ARTERIES: Left dominant configuration.There is short segment loss of opacification of the right V2 segment at the level of the C3 vertebra (series 5, image 211). This may be artifactual, as the remainder of the right vertebral artery is entirely normal. CTA HEAD FINDINGS POSTERIOR CIRCULATION: --Vertebral arteries: Normal V4 segments. --Inferior cerebellar arteries: Normal. --Basilar artery: Normal. --Superior cerebellar arteries: Normal. --Posterior cerebral arteries (PCA): Normal. Both posterior communicating arteries are patent. ANTERIOR CIRCULATION: --Intracranial internal carotid arteries: Normal. --Anterior cerebral  arteries (ACA): The left A1 segment is non-opacified, but otherwise the anterior cerebral arteries are normal. --Middle cerebral arteries (MCA): The left MCA is completely occluded with essentially no collateral blood flow demonstrated in the MCA territory. The right MCA is normal. VENOUS SINUSES: As permitted by contrast timing, patent. Incidental areas of fat attenuation of the inter hemispheric fissure ANATOMIC VARIANTS: None Review of the MIP images confirms the above findings. IMPRESSION: 1. 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 2. Complete occlusion of the left MCA with essentially no collateral blood flow in the MCA territory. 3. Short segment loss of opacification of the right V2 segment at the level of the C3 vertebra, which may be artifactual, as the remainder of the right vertebral artery is entirely normal. Critical Value/emergent results were called by telephone at the time of interpretation on 09/19/2022 at 2:45 am to provider [facility] , who verbally acknowledged these results. Electronically Signed   By: Deatra Robinson M.D.   On: 09/19/2022 02:50   CT HEAD CODE STROKE WO CONTRAST  Result Date: 09/19/2022  CLINICAL DATA:  Code stroke.  Weakness EXAM: CT HEAD WITHOUT CONTRAST TECHNIQUE: Contiguous axial images were obtained from the base of the skull through the vertex without intravenous contrast. RADIATION DOSE REDUCTION: This exam was performed according to the departmental dose-optimization program which includes automated exposure control, adjustment of the mA and/or kV according to patient size and/or use of iterative reconstruction technique. COMPARISON:  None Available. FINDINGS: Brain: There is no mass, hemorrhage or extra-axial collection. The size and configuration of the ventricles and extra-axial CSF spaces are normal. The brain parenchyma is normal, without evidence of acute or chronic infarction. Vascular: Hyperdense left MCA M1 segment  Skull: The visualized skull base, calvarium and extracranial soft tissues are normal. Sinuses/Orbits: No fluid levels or advanced mucosal thickening of the visualized paranasal sinuses. No mastoid or middle ear effusion. The orbits are normal. ASPECTS (Alberta Stroke Program Early CT Score) - Ganglionic level infarction (caudate, lentiform nuclei, internal capsule, insula, M1-M3 cortex): 5 (abnormal insula and left lentiform nucleus) - Supraganglionic infarction (M4-M6 cortex): 3 Total score (0-10 with 10 being normal): 8 IMPRESSION: 1. No intracranial hemorrhage. 2. Hyperdense left MCA M1 segment. 3. ASPECTS is 8. Dr. Erick Blinks at 2:31 am on 09/19/2022 by text page via the [facility] messaging system. Electronically Signed   By: Deatra Robinson M.D.   On: 09/19/2022 02:40    Procedures .Critical Care  Performed by: Shon Baton, MD Authorized by: Shon Baton, MD   Critical care provider statement:    Critical care time (minutes):  31   Critical care was necessary to treat or prevent imminent or life-threatening deterioration of the following conditions: Acute stroke, LVO positive.   Critical care was time spent personally by me on the following activities:  Development of treatment plan with patient or surrogate, discussions with consultants, evaluation of patient's response to treatment, examination of patient, ordering and review of laboratory studies, ordering and review of radiographic studies, ordering and performing treatments and interventions, pulse oximetry, re-evaluation of patient's condition and review of old charts     Medications Ordered in ED Medications  propofol (DIPRIVAN) 1000 MG/100ML infusion (has no administration in time range)  iohexol (OMNIPAQUE) 350 MG/ML injection 75 mL (75 mLs Intravenous Contrast Given 09/19/22 0233)    ED Course/ Medical Decision Making/ A&P                                 Medical Decision Making Amount and/or Complexity of Data  Reviewed Labs: ordered.  Risk Prescription drug management.  This patient presents to the ED for concern of stroke, this involves an extensive number of treatment options, and is a complaint that carries with it a high risk of complications and morbidity.  I considered the following differential and admission for this acute, potentially life threatening condition.  The differential diagnosis includes ischemic stroke, hemorrhagic stroke, seizure  MDM:    This is a 62 year old male who presents hypertensive and intermittently bradycardic.  Noted to be deficit mostly on the right side and aphasic.  Highly concerning for acute stroke.  Could be hemorrhage.  Clear for CT although airway is tenuous as he has vomited and is not following commands.  Neurology at the bedside.  CT shows a dense left MCA sign.  Plan for intubation for airway protection and IR evaluation.  Not a candidate for tPA.  Patient was intubated by PA, see additional notes for details.  I  was present for the entire procedure.  He was placed on propofol for sedation.  Chest x-ray at bedside was reviewed and ET tube appears in appropriate position.  Handoff to neurology.  (Labs, imaging, consults)  Labs: I Ordered, and personally interpreted labs.  The pertinent results include: Stroke labs  Imaging Studies ordered: I ordered imaging studies including CT, CTA, chest x-ray I independently visualized and interpreted imaging. I agree with the radiologist interpretation  Additional history obtained from friend at bedside, EMS.  External records from outside source obtained and reviewed including prior evaluations  Cardiac Monitoring: The patient was maintained on a cardiac monitor.  If on the cardiac monitor, I personally viewed and interpreted the cardiac monitored which showed an underlying rhythm of: Sinus rhythm, sinus bradycardia  Reevaluation: After the interventions noted above, I reevaluated the patient and found that they  have :worsened  Social Determinants of Health:  N/A  Disposition: Admit, to IR  Co morbidities that complicate the patient evaluation  Past Medical History:  Diagnosis Date   Hypertension      Medicines Meds ordered this encounter  Medications   iohexol (OMNIPAQUE) 350 MG/ML injection 75 mL   propofol (DIPRIVAN) 1000 MG/100ML infusion    I have reviewed the patients home medicines and have made adjustments as needed  Problem List / ED Course: Problem List Items Addressed This Visit   None Visit Diagnoses     Acute cerebrovascular accident (CVA) due to occlusion of left middle cerebral artery (HCC)    -  Primary                    Final Clinical Impression(s) / ED Diagnoses Final diagnoses:  Acute cerebrovascular accident (CVA) due to occlusion of left middle cerebral artery [facility])    Rx / DC Orders ED Discharge Orders     None         Sarina Robleto, Mayer Masker, MD 09/19/22 [phone]

## 2022-09-19 NOTE — Progress Notes (Signed)
OT Cancellation Note  Patient Details Name: Finesse Shead MRN: [id number] DOB: 03-22-1960   Cancelled Treatment:    Reason Eval/Treat Not Completed: Active bedrest order. Will follow as see as able.   Barry Brunner, OT Acute Rehabilitation Services Office [phone]   Chancy Milroy 09/19/2022, 8:15 AM

## 2022-09-19 NOTE — ED Notes (Signed)
Patient transported to CT for perfusion scan. Pt agitated, propofol bolus given;180 mg over 5 mins. Agitation continues fentanyl 100 mcg given as ordered.

## 2022-09-19 NOTE — ED Provider Notes (Signed)
  INTUBATION Performed by: Garlon Hatchet  Required items: required blood products, implants, devices, and special equipment available Patient identity confirmed: provided demographic data and hospital-assigned identification number Time out: Immediately prior to procedure a "time out" was called to verify the correct patient, procedure, equipment, support staff and site/side marked as required.  Indications: airway protection, stroke  Intubation method: Glidescope Laryngoscopy   Preoxygenation: BVM  Sedatives: Etomidate Paralytic: Succinylcholine  Tube Size: 8.0 cuffed  Post-procedure assessment: chest rise and ETCO2 monitor Breath sounds: equal and absent over the epigastrium Tube secured with: ETT holder Chest x-ray interpreted by radiologist and me.  Chest x-ray findings: endotracheal tube in appropriate position on bedside read  Patient tolerated the procedure well with no immediate complications.     Garlon Hatchet, PA-C 09/19/22 [id number]    Shon Baton, MD 09/19/22 [phone]

## 2022-09-19 NOTE — Progress Notes (Signed)
Pt in OR .

## 2022-09-19 NOTE — ED Notes (Signed)
Patient transported to CT 

## 2022-09-19 NOTE — ED Notes (Signed)
Pt transport to IR 

## 2022-09-19 NOTE — H&P (Signed)
NEUROLOGY CONSULTATION NOTE   Date of service: September 19, 2022 Patient Name: Marcus Wall MRN:  [id number] DOB:  02/07/1960 _ _ _   _ __   _ __ _ _  __ __   _ __   __ _  History of Present Illness  Marcus Wall is a 62 y.o. male with PMH significant for HTN who presents as code stroke with R sided weakness, L gaze preference, vomiting, R hemianopsia.  He was with a friend. Went to bed at [facility] and around [phone], friend saw him hanging half off the bed, making grunting noises, vomiting, diaphoretic, moving only his left side.  EMS called and he was brought in as a code stroke. Per EMS, periodically his heart rate would drop to 30s and they intermittently had to pace him. He was hypertensive thou to 200s systolic and did not go hypotensive.  CT Head with hyperdense L MCA with developing L MCA stroke and ASPECTS of 8. CTA with occlusion of the left ICA at the bifurcation and occluded all the way to left MCA.  He was pulled out of the scanner to be intubated and get better IV access for CT Perfusion. CT perfusion with core of and penumbra of 86ml.  LKW: 2000 mRS: 0 tNKASE: not offered, outside window Thrombectomy: taken to IR for thrombectomy. Unable to identify next of kin and thus emergency 2 physician consent used. I discussed this patient's presentation with Dr. Corliss Skains and we both agree that given his age, his risk of developing malignant edema from this large of a stroke is high and thus was taken to IR for thrombectomy. He will likely have significant deficits given noted large core on CT perfusion.  NIHSS components Score: Comment  1a Level of Conscious 0[]  1[]  2[x]  3[]      1b LOC Questions 0[]  1[]  2[x]       1c LOC Commands 0[]  1[]  2[x]       2 Best Gaze 0[]  1[]  2[x]       3 Visual 0[]  1[]  2[x]  3[]      4 Facial Palsy 0[]  1[x]  2[]  3[]      5a Motor Arm - left 0[x]  1[]  2[]  3[]  4[]  UN[]    5b Motor Arm - Right 0[]  1[]  2[]  3[]  4[x]  UN[]    6a Motor Leg - Left  0[]  1[x]  2[]  3[]  4[]  UN[]    6b Motor Leg - Right 0[]  1[]  2[]  3[]  4[x]  UN[]    7 Limb Ataxia 0[x]  1[]  2[]  3[]  UN[]     8 Sensory 0[]  1[]  2[x]  UN[]      9 Best Language 0[]  1[]  2[]  3[x]      10 Dysarthria 0[]  1[]  2[x]  UN[]      11 Extinct. and Inattention 0[x]  1[]  2[]       TOTAL: 27      ROS   Unable to obtain with drowsiness and aphasia  Past History   Past Medical History:  Diagnosis Date   Hypertension    Past Surgical History:  Procedure Laterality Date   BACK SURGERY     KNEE ARTHROCENTESIS     Family History  Problem Relation Age of Onset   Hypertension Mother    Alzheimer's disease Mother    Diabetes Father    Social History   Socioeconomic History   Marital status: Single    Spouse name: Not on file   Number of children: Not on file   Years of education: Not on file   Highest education level: Not on file  Occupational History   Not on file  Tobacco Use   Smoking status: Some Days    Types: Cigars   Smokeless tobacco: Never  Vaping Use   Vaping status: Never Used  Substance and Sexual Activity   Alcohol use: No   Drug use: No   Sexual activity: Not on file  Other Topics Concern   Not on file  Social History Narrative   Not on file   Social Determinants of Health   Financial Resource Strain: Not on file  Food Insecurity: Not on file  Transportation Needs: Not on file  Physical Activity: Not on file  Stress: Not on file  Social Connections: Not on file   No Known Allergies  Medications  (Not in a hospital admission)    Vitals   Vitals:   09/19/22 0200  Weight: (!) 148.8 kg     Body mass index is 36.03 kg/m.  Physical Exam   General: Laying comfortably in bed; moving around, not redirectable. HENT: Normal oropharynx and mucosa. Normal external appearance of ears and nose.  Neck: Supple, no pain or tenderness  CV: No JVD. No peripheral edema.  Pulmonary: Symmetric Chest rise. Normal respiratory effort.  Abdomen: Soft to touch,  non-tender.  Ext: No cyanosis, edema, or deformity  Skin: No rash. Normal palpation of skin.   Musculoskeletal: Normal digits and nails by inspection. No clubbing.   Neurologic Examination  Mental status/Cognition: drowsy and somnolent, mute and does not answer any orientation questions. Speech/language: global aphasia, mute. Cranial nerves:   CN II Pupils equal and reactive to light, does not blink to threat on the right.   CN III,IV,VI L gaze preference, crosses midline.   CN V Corneals intact BL   CN VII Mild R facial droop   CN VIII Makes eye contact to speech on the left   CN IX & X Unable to assess.   CN XI Head turned to the left slightly.   CN XII Unable to assess.   Motor:  Muscle bulk: normal, tone flaccid in RUE Spontaneous and antigravity movement in LUE. No movement in RUE. Spontaneous and antigravity movement in LLE. Some movement in RLE.  Reflexes: Unable to assess  Sensation: Grimaces to pinch on the left but not on the right.  Coordination/Complex Motor:  Unable to assess.  Labs   CBC:  Recent Labs  Lab 09/19/22 0245  HGB 15.0  HCT 44.0    Basic Metabolic Panel:  Lab Results  Component Value Date   NA 136 09/19/2022   K 6.0 (H) 09/19/2022   CO2 25 05/02/2017   GLUCOSE 188 (H) 09/19/2022   BUN 25 (H) 09/19/2022   CREATININE 1.20 09/19/2022   CALCIUM 9.1 05/02/2017   GFRNONAA >60 05/02/2017   GFRAA >60 05/02/2017   Lipid Panel:  Lab Results  Component Value Date   LDLCALC  03/10/2010    88        Total Cholesterol/HDL:CHD Risk Coronary Heart Disease Risk Table                     Men   Women  1/2 Average Risk   3.4   3.3  Average Risk       5.0   4.4  2 X Average Risk   9.6   7.1  3 X Average Risk  23.4   11.0        Use the calculated Patient Ratio above and the CHD Risk Table to determine  the patient's CHD Risk.        ATP III CLASSIFICATION (LDL):  <100     mg/dL   Optimal  696-295  mg/dL   Near or Above                     Optimal  130-159  mg/dL   Borderline  284-132  mg/dL   High  >440     mg/dL   Very High   NUUV2Z:  Lab Results  Component Value Date   HGBA1C (H) 03/10/2010    6.1 (NOTE)                                                                       According to the ADA Clinical Practice Recommendations for 2011, when HbA1c is used as a screening test:   >=6.5%   Diagnostic of Diabetes Mellitus           (if abnormal result  is confirmed)  5.7-6.4%   Increased risk of developing Diabetes Mellitus  References:Diagnosis and Classification of Diabetes Mellitus,Diabetes Care,2011,34(Suppl 1):S62-S69 and Standards of Medical Care in         Diabetes - 2011,Diabetes Care,2011,34  (Suppl 1):S11-S61.   Urine Drug Screen: No results found for: "LABOPIA", "COCAINSCRNUR", "LABBENZ", "AMPHETMU", "THCU", "LABBARB"  Alcohol Level No results found for: "ETH"  CT Head without contrast(Personally reviewed): Developing L MCA stroke with hyperdense L MCA and ASPECTS of 8.  CT angio Head and Neck with contrast(Personally reviewed): L ICA occlusion at bifurcation and along the entire course. In addition, L MCA occlusion with poor distal flow.  CT Perfusion: core of and penumbra of 86ml.  MRI Brain(Personally reviewed): Pending  Impression   Marcus Wall is a 62 y.o. male with PMH significant for HTN who presents as code stroke with R sided weakness, L gaze preference, vomiting, R hemianopsia. He was found to have developing large L MCA stroke with ASPECTS of 8 and occlusion of L ICA at origin and along its entire length and L MCA M1 occlusion with poor distal/collateral flow.  He was outside window for tnkase. He was taken to IR for thrombectomy as a life saving measure to reduce risk of malignant MCA edema. Suspect that given the noted large core on CT perfusion, he is high risk for large L MCA stroke.  Recommendations  Acute L MCA stroke taken for thrombectomy: - Frequent Neuro checks per  stroke unit protocol - Recommend brain imaging with MRI Brain without contrast - Recommend obtaining TTE - Recommend obtaining Lipid panel with LDL - Please start statin if LDL > 70 - Recommend HbA1c to evaluate for diabetes and how well it is controlled. - Antithrombotic - Per Neuro IR for the first 24 hours after intervention. - Recommend DVT ppx - SBP goal - per Neuro IR for the first 24 hours after intervention. - Recommend Telemetry monitoring for arrythmia - Recommend bedside swallow screen prior to PO intake. - Stroke education booklet - Recommend PT/OT/SLP consult - Recommend Urine Tox screen.  HTN: - hold home meds. - goal SBP per Neuro IR for the first 24 hours after intervention. Will use PRN labetalol and Cleviprex to keep SBP within goal.   This  patient is critically ill and at significant risk of neurological worsening, death and care requires constant monitoring of vital signs, hemodynamics,respiratory and cardiac monitoring, neurological assessment, discussion with family, other specialists and medical decision making of high complexity. I spent 50 minutes of neurocritical care time  in the care of  this patient. This was time spent independent of any time provided by nurse practitioner or PA.  Erick Blinks [facility] 09/19/2022  4:41 AM  ______________________________________________________________________   Thank you for the opportunity to take part in the care of this patient. If you have any further questions, please contact the neurology consultation attending.  Signed,  Erick Blinks [facility] _ _ _   _ __   _ __ _ _  __ __   _ __   __ _

## 2022-09-19 NOTE — Progress Notes (Signed)
Transported patient from I.R to room 4 north 16. Patient remained stable during transport.

## 2022-09-19 NOTE — Progress Notes (Signed)
RT NOTE: holding SBT on patient this AM due to recent intubation and FIO2 requirements.  Tolerating current ventilator settings well at this time.  Will continue to monitor.

## 2022-09-19 NOTE — Progress Notes (Signed)
Carotid duplex bilateral study completed.   Please see CV Proc for preliminary results.   Rachel Hodge, RDMS, RVT  

## 2022-09-19 NOTE — Anesthesia Preprocedure Evaluation (Signed)
Anesthesia Evaluation  Patient identified by MRN, date of birth, ID band Patient unresponsive  Preop documentation limited or incomplete due to emergent nature of procedure.  Airway Mallampati: Intubated  TM Distance: >3 FB     Dental   Pulmonary  Aspiration   breath sounds clear to auscultation   + intubated    Cardiovascular hypertension, Normal cardiovascular exam     Neuro/Psych CVA (Code stroke)    GI/Hepatic negative GI ROS, Neg liver ROS,,,  Endo/Other  negative endocrine ROS    Renal/GU negative Renal ROS     Musculoskeletal   Abdominal   Peds  Hematology negative hematology ROS (+)   Anesthesia Other Findings   Reproductive/Obstetrics                             Anesthesia Physical Anesthesia Plan  ASA: 4 and emergent  Anesthesia Plan: General   Post-op Pain Management:    Induction: Inhalational  PONV Risk Score and Plan: 2 and Dexamethasone and Ondansetron  Airway Management Planned: Oral ETT  Additional Equipment: Arterial line  Intra-op Plan:   Post-operative Plan: Post-operative intubation/ventilation  Informed Consent: I have reviewed the patients History and Physical, chart, labs and discussed the procedure including the risks, benefits and alternatives for the proposed anesthesia with the patient or authorized representative who has indicated his/her understanding and acceptance.     Dental advisory given  Plan Discussed with: CRNA  Anesthesia Plan Comments:        Anesthesia Quick Evaluation

## 2022-09-19 NOTE — Progress Notes (Signed)
PT Cancellation Note  Patient Details Name: Moore Spizzirri MRN: [id number] DOB: 21-Sep-1960   Cancelled Treatment:    Reason Eval/Treat Not Completed: Medical issues which prohibited therapy;Patient not medically ready (Pt with active bedrest order, will follow at later date/time as pt medically ready and schedule allows.)   Wynn Maudlin, DPT Acute Rehabilitation Services Office [phone]  09/19/22 8:42 AM

## 2022-09-19 NOTE — Procedures (Signed)
INR.  Status post left common carotid arteriogram.  Right CFA approach.  Findings.  1.  Occluded left ICA terminus extending into the left anterior cerebral artery A1 segment and left middle cerebral artery M1 segment.  Status post complete revascularization of occluded  left ICA terminus, and left MCA and left ACA with 1 pass with a 3 mm x 40 mm Solitaire X retriever  device,and contact aspiration iusing a 071 aspiration catheter with proximal flow rest achieving aTICI 3 revascularization.  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  Post CT brain no evidence of intracranial hemorrhage or mass effect.  8 French Angio-Seal closure device deployed at the right groin puncture site for hemostasis.  Distal pulses all palpable/dopplerable in both feet unchanged from prior to the procedure.  Patient left intubated to protect airway due to he has medical accommodation.  S Javelle Donigan.  MD

## 2022-09-19 NOTE — Consult Note (Signed)
NAME:  Marcus Wall, MRN:  [id number], DOB:  1960-03-31, LOS: 0 ADMISSION DATE:  09/19/2022, CONSULTATION DATE:  9/10 REFERRING MD:  Derry Lory- Neuro, CHIEF COMPLAINT:  post- NIR   History of Present Illness:  Marcus Wall is a 62 year old gentleman with a history of hypertension who presented after being found hanging out of bed by his friend.  He was last known well around 8 PM.  Around 1:42 he was found with vomiting, diaphoresis, making grunting noises with left gaze preference.  He was only moving his left side at this point.  Imaging in the emergency department demonstrated hyperdense left MCA.  CTA demonstrated left ICA occlusion.  He was outside the TNK window.  He underwent neurointerventional thrombectomy of left ICA, left MCA, and left ACA achieving TICI 3 flow.  He had an acutely ulcerated plaque at the left proximal ICA with 60 to 65% stenosis.  No stents.  Imaging postintervention did not demonstrate any hemorrhage or mass effect.  Right femoral access site with Angio-Seal closure.  Pertinent  Medical History  Hypertension  Significant Hospital Events: Including procedures, antibiotic start and stop dates in addition to other pertinent events   9/10 admitted, neurointerventional thrombectomy of left ICA, MCA, ACA  Interim History / Subjective:    Objective   Blood pressure (!) 215/195, height 6\' 8"  (2.032 m), weight (!) 148.8 kg, SpO2 100%.    Vent Mode: PRVC FiO2 (%):  [100 %] 100 % Set Rate:  [20 bmp] 20 bmp Vt Set:  [600 mL] 600 mL PEEP:  [5 cmH20] 5 cmH20  No intake or output data in the 24 hours ending 09/19/22 0503 Filed Weights   09/19/22 0200 09/19/22 0223  Weight: (!) 148.8 kg (!) 148.8 kg    Examination: General: Critically ill-appearing man lying in bed intubated, sedated on propofol HENT: Gray/AT, eyes anicteric.  Conjunctival injection. Lungs: Breathing comfortably on mechanical ventilation, CTAB Cardiovascular: S1-S2, regular rate and  rhythm Abdomen: Soft, nontender, nondistended Extremities: No peripheral edema.  Well-healed surgical scars on both knees. 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  Show deconditioning obesity right ACA.  Presented outside TNK window. - S/p neuro IR intervention-strict blood pressure control systolic blood pressure 120-140.  Over proximal as needed. - Aspirin, statin - Appreciate neurology's management. - Brain MRI ordered. - PT, OT, SLP - Monitor on telemetry -Check A1c, lipids  Postop vent management - Okay to wake up and wean.  Since he was found after vomiting in bed may have increased risk for aspiration pneumonia.  Abnormal chest x-ray-wide mediastinum - Repeat chest x-ray postprocedure, few days send remains very wide would recommend CT imaging of the chest  History of hypertension - Hold PTA antihypertensives for now  Hyperglycemia -A1c pending -SSI PRN-moderate scale -goal BG 140-180  Best Practice (right click and "Reselect all SmartList Selections" daily)   Diet/type: NPO DVT prophylaxis: LMWH GI prophylaxis: PPI Lines: Arterial Line Foley:  Yes, and it is still needed Code Status:  full code Last date of multidisciplinary goals of care discussion [n/a]  Labs   CBC: Recent Labs  Lab 09/19/22 0245 09/19/22 0251  WBC  --  6.1  NEUTROABS  --  2.1  HGB  15.0 15.0  HCT 44.0 45.3  MCV  --  85.5  PLT  --  PLATELET CLUMPS NOTED ON SMEAR, UNABLE TO ESTIMATE    Basic Metabolic Panel: Recent Labs  Lab 09/19/22 0245  NA 136  K 6.0*  CL 102  GLUCOSE 188*  BUN 25*  CREATININE 1.20   GFR: Estimated Creatinine Clearance: 107.1 mL/min (by C-G formula based on  SCr of 1.2 mg/dL). Recent Labs  Lab 09/19/22 0245 09/19/22 0251  WBC  --  6.1  LATICACIDVEN 2.4*  --     Liver Function Tests: No results for input(s): "AST", "ALT", "ALKPHOS", "BILITOT", "PROT", "ALBUMIN" in the last 168 hours. No results for input(s): "LIPASE", "AMYLASE" in the last 168 hours. No results for input(s): "AMMONIA" in the last 168 hours.  ABG    Component Value Date/Time   TCO2 26 09/19/2022 0245     Coagulation Profile: No results for input(s): "INR", "PROTIME" in the last 168 hours.  Cardiac Enzymes: No results for input(s): "CKTOTAL", "CKMB", "CKMBINDEX", "TROPONINI" in the last 168 hours.  HbA1C: Hgb A1c MFr Bld  Date/Time Value Ref Range Status  03/10/2010 03:00 AM (H) <5.7 % Final   6.1 (NOTE)                                                                       According to the ADA Clinical Practice Recommendations for 2011, when HbA1c is used as a screening test:   >=6.5%   Diagnostic of Diabetes Mellitus           (if abnormal result  is confirmed)  5.7-6.4%   Increased risk of developing Diabetes Mellitus  References:Diagnosis and Classification of Diabetes Mellitus,Diabetes Care,2011,34(Suppl 1):S62-S69 and Standards of Medical Care in         Diabetes - 2011,Diabetes Care,2011,34  (Suppl 1):S11-S61.    CBG: Recent Labs  Lab 09/19/22 0240  GLUCAP 182*    Review of Systems:   Unable to be obtained due to mental status  Past Medical History:  He,  has a past medical history of Hypertension.   Surgical History:   Past Surgical History:  Procedure Laterality Date   BACK SURGERY     KNEE ARTHROCENTESIS       Social History:   reports that he has been smoking cigars. He has never used smokeless tobacco. He reports that he does not drink alcohol and does not use drugs.   Family History:  His family history includes Alzheimer's disease in his mother; Diabetes in his father; Hypertension in his mother.   Allergies No Known Allergies    Home Medications  Prior to Admission medications   Medication Sig Start Date End Date Taking? Authorizing Provider  methocarbamol (ROBAXIN) 500 MG tablet Take 1 tablet (500 mg total) by mouth 2 (two) times daily. 11/30/17   Aviva Kluver B, PA-C  metroNIDAZOLE (FLAGYL) 500 MG tablet Take 1 tablet (500 mg total) by mouth 2 (two) times daily. Patient not taking: Reported on 11/30/2017 04/26/16   Derwood Kaplan, MD  omeprazole (PRILOSEC) 20 MG capsule Take 1 capsule (20 mg total) by mouth daily. Patient not taking: Reported on 11/30/2017 04/26/16   Derwood Kaplan, MD  ondansetron (ZOFRAN ODT) 4 MG disintegrating tablet 4mg   ODT q4 hours prn nausea/vomit Patient not taking: Reported on 11/30/2017 06/03/15   Loren Racer, MD     Critical care time: 40 min.     Steffanie Dunn, DO 09/19/22 5:41 AM [facility]  For contact information, see Amion. If no response to pager, please call PCCM consult pager. After hours, 7PM- 7AM, please call Elink.

## 2022-09-19 NOTE — Progress Notes (Signed)
Referring Physician(s): Stroke team, Erick Blinks   Supervising Physician: Julieanne Cotton  Patient Status:  [facility] - In-pt  Chief Complaint:   R sided weakness, L gaze preference, vomiting, R hemianopsia.  S/p  complete revascularization of occluded  left ICA terminus, and left MCA and left ACA with 1 pass with a 3 mm x 40 mm Solitaire X retriever  device,and contact aspiration iusing a 071 aspiration catheter with proximal flow rest achieving aTICI 3 revascularization, performed by Dr. Corliss Skains.   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   Subjective:  Patient laying in bed, intubated and sedated. NT, RN and close friend at bedside.  Friend says she contacted patient's family.  RN states that pt moved left arm/leg when propofol was weaned off last night, not much movement seen in right.  Patient kept in sedation due to CXR finding, pt may need bedside bronch per RN.   Allergies: Patient has no known allergies.  Medications: Prior to Admission medications   Medication Sig Start Date End Date Taking? Authorizing Provider  methocarbamol (ROBAXIN) 500 MG tablet Take 1 tablet (500 mg total) by mouth 2 (two) times daily. 11/30/17   Aviva Kluver B, PA-C  metroNIDAZOLE (FLAGYL) 500 MG tablet Take 1 tablet (500 mg total) by mouth 2 (two) times daily. Patient not taking: Reported on 11/30/2017 04/26/16   Derwood Kaplan, MD  omeprazole (PRILOSEC) 20 MG capsule Take 1 capsule (20 mg total) by mouth daily. Patient not taking: Reported on 11/30/2017 04/26/16   Derwood Kaplan, MD  ondansetron (ZOFRAN ODT) 4 MG disintegrating tablet 4mg  ODT q4 hours prn nausea/vomit Patient not taking: Reported on 11/30/2017 06/03/15   Loren Racer, MD     Vital Signs: BP 101/72   Pulse 82   Temp 98.6 F (37 C) (Axillary)   Resp 20   Ht 6\' 3"  (1.905 m)   Wt (!) 328 lb 0.7 oz (148.8 kg)   SpO2 99%   BMI 41.00 kg/m    Physical Exam Vitals reviewed.  Constitutional:      Comments: Intubated and sedated   Abdominal:     General: Abdomen is flat.     Palpations: Abdomen is soft.  Skin:    General: Skin is warm and dry.     Coloration: Skin is not jaundiced or pale.     Comments: Positive dressing on R CFA puncture site. Dressing clean, dry, and intact, site is soft, no appreciable pseudoaneurysm or hematoma. R DP 2+       Imaging: DG CHEST PORT 1 VIEW  Result Date: 09/19/2022 CLINICAL DATA:  62 year old male left MCA stroke.  Intubated. EXAM: PORTABLE CHEST 1 VIEW COMPARISON:  Portable chest 09/19/2022 and earlier. FINDINGS: Portable AP semi upright view at 0541 hours. Endotracheal tube position not significantly changed, at the lower clavicles now. Interval right upper lung volume loss and dense opacification, likely upper lobe collapse. Some air bronchograms there. Enteric tube looped in the left upper quadrant. Stable cardiac size and mediastinal contours. Lung ventilation elsewhere within normal limits. No pneumothorax or pleural effusion identified. No acute osseous abnormality identified. Negative visible bowel gas. IMPRESSION: 1.  Stable lines and tubes. 2. But new right upper lobe collapse since 0236 hours. Recommend pulmonary toilet and repeat portable chest x-ray. These results will be called to the ordering clinician or representative by the Radiologist Assistant, and communication documented in the PACS or [employer]. Electronically Signed  By: Odessa Fleming M.D.   On: 09/19/2022 06:37   DG Chest Portable 1 View  Result Date: 09/19/2022 CLINICAL DATA:  Endotracheal tube placement. EXAM: PORTABLE CHEST 1 VIEW COMPARISON:  05/02/2017. FINDINGS: The heart size and mediastinal contours are within normal limits. Lung volumes are low. No consolidation, effusion, or pneumothorax. An endotracheal tube terminates 3.6 cm above the carina. An enteric tube is seen over the proximal to mid esophagus and not  visualized distal to this point. No acute osseous abnormality. IMPRESSION: 1. No active disease. 2. Endotracheal tube terminates 3.6 cm above the carina. 3. Enteric tube is seen over the proximal and mid esophagus and is not visualized distal to this point. Additional views are recommended. Electronically Signed   By: Thornell Sartorius M.D.   On: 09/19/2022 04:26   CT CEREBRAL PERFUSION W CONTRAST  Result Date: 09/19/2022 CLINICAL DATA:  Left MCA stroke EXAM: CT PERFUSION BRAIN TECHNIQUE: Multiphase CT imaging of the brain was performed following IV bolus contrast injection. Subsequent parametric perfusion maps were calculated using RAPID software. RADIATION DOSE REDUCTION: This exam was performed according to the departmental dose-optimization program which includes automated exposure control, adjustment of the mA and/or kV according to patient size and/or use of iterative reconstruction technique. CONTRAST:  45mL OMNIPAQUE IOHEXOL 350 MG/ML SOLN COMPARISON:  CTA head neck same day FINDINGS: CT Brain Perfusion Findings: CBF (<30%) Volume: Perfusion (Tmax>6.0s) volume: Mismatch Volume: 86mL ASPECTS on noncontrast CT Head: 8 at 2:28 a.m. today. Infarct Core: 236 mL is reported, which includes nearly the entire left anterior and middle cerebral artery territories. On further review of the CTA images, there is diminished opacification in the distal A2 segment of the left anterior cerebral artery (series 11, image 38, 39 of that examination). Mismatch location:Predominantly within the posterior left temporal lobe. Technical notes: Mild translational motion during the examination. IMPRESSION: 1. A 236 mL core infarct involving nearly the entire left anterior and middle cerebral artery territories. 2. An 86 mL calculated penumbra, predominantly within the posterior left temporal lobe. 3. On further review of the CTA images, there is diminished opacification in the distal A2 segment of the left anterior  cerebral artery, though it is widely patent proximally. These results were communicated to Dr. Erick Blinks at 3:23 am on 09/19/2022 by text page via the [facility] messaging system. Electronically Signed   By: Deatra Robinson M.D.   On: 09/19/2022 03:23   CT ANGIO HEAD NECK W WO CM (CODE STROKE)  Result Date: 09/19/2022 CLINICAL DATA:  Weakness EXAM: CT ANGIOGRAPHY HEAD AND NECK WITH AND WITHOUT CONTRAST TECHNIQUE: Multidetector CT imaging of the head and neck was performed using the standard protocol during bolus administration of intravenous contrast. Multiplanar CT image reconstructions and MIPs were obtained to evaluate the vascular anatomy. Carotid stenosis measurements (when applicable) are obtained utilizing NASCET criteria, using the distal internal carotid diameter as the denominator. RADIATION DOSE REDUCTION: This exam was performed according to the departmental dose-optimization program which includes automated exposure control, adjustment of the mA and/or kV according to patient size and/or use of iterative reconstruction technique. CONTRAST:  75mL OMNIPAQUE IOHEXOL 350 MG/ML SOLN COMPARISON:  None Available. FINDINGS: CTA NECK FINDINGS SKELETON: There is no bony spinal canal stenosis. No lytic or blastic lesion. OTHER NECK: Normal pharynx, larynx and major salivary glands. No cervical lymphadenopathy. Unremarkable thyroid gland. UPPER CHEST: No pneumothorax or pleural effusion. No nodules or masses. AORTIC ARCH: There is no calcific atherosclerosis of the aortic arch. Conventional  3 vessel aortic branching pattern. RIGHT CAROTID SYSTEM: Normal without aneurysm, dissection or stenosis. LEFT CAROTID SYSTEM: Left common carotid artery is patent and of normal caliber. The left ICA is occluded at its origin and remains occluded along its entire course. VERTEBRAL ARTERIES: Left dominant configuration.There is short segment loss of opacification of the right V2 segment at the level of the C3 vertebra (series  5, image 211). This may be artifactual, as the remainder of the right vertebral artery is entirely normal. CTA HEAD FINDINGS POSTERIOR CIRCULATION: --Vertebral arteries: Normal V4 segments. --Inferior cerebellar arteries: Normal. --Basilar artery: Normal. --Superior cerebellar arteries: Normal. --Posterior cerebral arteries (PCA): Normal. Both posterior communicating arteries are patent. ANTERIOR CIRCULATION: --Intracranial internal carotid arteries: Normal. --Anterior cerebral arteries (ACA): The left A1 segment is non-opacified, but otherwise the anterior cerebral arteries are normal. --Middle cerebral arteries (MCA): The left MCA is completely occluded with essentially no collateral blood flow demonstrated in the MCA territory. The right MCA is normal. VENOUS SINUSES: As permitted by contrast timing, patent. Incidental areas of fat attenuation of the inter hemispheric fissure ANATOMIC VARIANTS: None Review of the MIP images confirms the above findings. IMPRESSION: 1. 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 2. Complete occlusion of the left MCA with essentially no collateral blood flow in the MCA territory. 3. Short segment loss of opacification of the right V2 segment at the level of the C3 vertebra, which may be artifactual, as the remainder of the right vertebral artery is entirely normal. Critical Value/emergent results were called by telephone at the time of interpretation on 09/19/2022 at 2:45 am to provider [facility] , who verbally acknowledged these results. Electronically Signed   By: Deatra Robinson M.D.   On: 09/19/2022 02:50   CT HEAD CODE STROKE WO CONTRAST  Result Date: 09/19/2022 CLINICAL DATA:  Code stroke.  Weakness EXAM: CT HEAD WITHOUT CONTRAST TECHNIQUE: Contiguous axial images were obtained from the base of the skull through the vertex without intravenous contrast. RADIATION DOSE REDUCTION: This exam was performed according to the departmental  dose-optimization program which includes automated exposure control, adjustment of the mA and/or kV according to patient size and/or use of iterative reconstruction technique. COMPARISON:  None Available. FINDINGS: Brain: There is no mass, hemorrhage or extra-axial collection. The size and configuration of the ventricles and extra-axial CSF spaces are normal. The brain parenchyma is normal, without evidence of acute or chronic infarction. Vascular: Hyperdense left MCA M1 segment Skull: The visualized skull base, calvarium and extracranial soft tissues are normal. Sinuses/Orbits: No fluid levels or advanced mucosal thickening of the visualized paranasal sinuses. No mastoid or middle ear effusion. The orbits are normal. ASPECTS (Alberta Stroke Program Early CT Score) - Ganglionic level infarction (caudate, lentiform nuclei, internal capsule, insula, M1-M3 cortex): 5 (abnormal insula and left lentiform nucleus) - Supraganglionic infarction (M4-M6 cortex): 3 Total score (0-10 with 10 being normal): 8 IMPRESSION: 1. No intracranial hemorrhage. 2. Hyperdense left MCA M1 segment. 3. ASPECTS is 8. Dr. Erick Blinks at 2:31 am on 09/19/2022 by text page via the [facility] messaging system. Electronically Signed   By: Deatra Robinson M.D.   On: 09/19/2022 02:40    Labs:  CBC: Recent Labs    09/19/22 0245 09/19/22 0251 09/19/22 0529  WBC  --  6.1 5.8  HGB 15.0 15.0 14.8  HCT 44.0 45.3 45.1  PLT  --  PLATELET CLUMPS NOTED ON SMEAR, UNABLE TO ESTIMATE 195    COAGS: Recent  Labs    09/19/22 0529  INR 1.0  APTT 30    BMP: Recent Labs    09/19/22 0245 09/19/22 0529  NA 136 135  K 6.0* 4.2  CL 102 104  CO2  --  24  GLUCOSE 188* 143*  BUN 25* 14  CALCIUM  --  8.7*  CREATININE 1.20 1.01  GFRNONAA  --  >60    LIVER FUNCTION TESTS: Recent Labs    09/19/22 0529  BILITOT 0.3  AST 26  ALT 36  ALKPHOS 68  PROT 7.3  ALBUMIN 3.7    Assessment and Plan:  62 y.o. male with hx of HTN who was  brought to ED ad Code stroke due to  R sided weakness, L gaze preference, vomiting, R hemianopsia.  S/p  complete revascularization of occluded  left ICA terminus, and left MCA and left ACA with 1 pass with a 3 mm x 40 mm Solitaire X retriever  device,and contact aspiration iusing a 071 aspiration catheter with proximal flow rest achieving a TICI 3 revascularization, performed by Dr. Corliss Skains this morning.   Remained intubated and sedated.  Positive dressing on R CFA puncture site. Dressing clean, dry, and intact, site is soft, no appreciable pseudoaneurysm or hematoma. R DP 2+  Further treatment plan per Stroke team/PCCM  Appreciate and agree with the plan.  NIR to follow.    Electronically Signed: Willette Brace, PA-C 09/19/2022, 12:40 PM   I spent a total of 15 Minutes at the the patient's bedside AND on the patient's hospital floor or unit, greater than 50% of which was counseling/coordinating care for L ICA/MCA/ ACA mechanical thrombectomy.   This chart was dictated using voice recognition software.  Despite best efforts to proofread,  errors can occur which can change the documentation meaning.

## 2022-09-19 NOTE — Progress Notes (Signed)
SLP Cancellation Note  Patient Details Name: Marcus Wall MRN: [id number] DOB: [birth year]   Cancelled treatment:       Reason Eval/Treat Not Completed: Patient not medically ready   Nachum Derossett, Riley Nearing 09/19/2022, 10:40 AM

## 2022-09-19 NOTE — Progress Notes (Signed)
Pt transported from 4N16 to MRI and back by RN and RT w/o complications

## 2022-09-19 NOTE — Anesthesia Preprocedure Evaluation (Signed)
Anesthesia Evaluation  Patient identified by MRN, date of birth, ID band Patient unresponsive    Reviewed: Allergy & Precautions, H&P , NPO status , Patient's Chart, lab work & pertinent test results  Airway Mallampati: Intubated  TM Distance: >3 FB Neck ROM: Full    Dental no notable dental hx. (+) Teeth Intact, Dental Advisory Given   Pulmonary neg pulmonary ROS   Pulmonary exam normal breath sounds clear to auscultation       Cardiovascular hypertension,  Rhythm:Regular Rate:Normal     Neuro/Psych CVA, Residual Symptoms  negative psych ROS   GI/Hepatic negative GI ROS, Neg liver ROS,,,  Endo/Other    Morbid obesity  Renal/GU negative Renal ROS  negative genitourinary   Musculoskeletal   Abdominal   Peds  Hematology negative hematology ROS (+)   Anesthesia Other Findings   Reproductive/Obstetrics negative OB ROS                             Anesthesia Physical Anesthesia Plan  ASA: 4 and emergent  Anesthesia Plan: General   Post-op Pain Management: Minimal or no pain anticipated   Induction: Intravenous  PONV Risk Score and Plan: 3 and Treatment may vary due to age or medical condition  Airway Management Planned: Oral ETT  Additional Equipment: Arterial line  Intra-op Plan:   Post-operative Plan: Post-operative intubation/ventilation  Informed Consent: I have reviewed the patients History and Physical, chart, labs and discussed the procedure including the risks, benefits and alternatives for the proposed anesthesia with the patient or authorized representative who has indicated his/her understanding and acceptance.     Dental advisory given  Plan Discussed with: CRNA  Anesthesia Plan Comments:        Anesthesia Quick Evaluation

## 2022-09-19 NOTE — Progress Notes (Signed)
Initial Nutrition Assessment  DOCUMENTATION CODES:   Not applicable  INTERVENTION:   Initiate tube feeding via OG: Osmolite 1.5 at 25 ml/h and increase by 10 ml every 8 hours to goal rate of 65 ml/hr (1560 ml per day) Prosource TF20 60 ml BID  Provides 2500 kcal, 137 gm protein, 1185 ml free water daily   NUTRITION DIAGNOSIS:   Inadequate oral intake related to inability to eat as evidenced by NPO status.  GOAL:   Patient will meet greater than or equal to 90% of their needs  MONITOR:   TF tolerance  REASON FOR ASSESSMENT:   Consult Enteral/tube feeding initiation and management  ASSESSMENT:   Pt with PMH of HTN admitted after being found down by friend with L MCA and ACA strokes due to L ICA distal occlusion s/p neurointerventional thrombectomy.   Pt discussed during ICU rounds and with RN.  Spoke with RN who is at bedside.  Spoke with friends who are at bedside. They are not aware of any recent nutrition issues.  Does smoke black and milds 2 per day.   Noted plans for surgery today, TF held until pt post op.    Medications reviewed and include: colace, pepcid, 2-6 units novolog every 4 hours, miralax  Fentanyl  Propofol @ 35 ml/hr (newly started) 924 kcal  Hypertonic saline   Labs reviewed:  A1C: 6.7 CBG's: 164-172  OG tube; xray pending   NUTRITION - FOCUSED PHYSICAL EXAM:  Flowsheet Row Most Recent Value  Orbital Region No depletion  Upper Arm Region No depletion  Thoracic and Lumbar Region No depletion  Buccal Region Unable to assess  Temple Region No depletion  Clavicle Bone Region No depletion  Clavicle and Acromion Bone Region No depletion  Scapular Bone Region Unable to assess  Dorsal Hand No depletion  Patellar Region No depletion  Anterior Thigh Region No depletion  Posterior Calf Region No depletion  Edema (RD Assessment) None  Hair Reviewed  Eyes Unable to assess  Mouth Unable to assess  Skin Reviewed  Nails Reviewed        Diet Order:   Diet Order             Diet NPO time specified  Diet effective now                   EDUCATION NEEDS:   Not appropriate for education at this time  Skin:  Skin Assessment: Reviewed RN Assessment (R groin incision)  Last BM:  unknown  Height:   Ht Readings from Last 1 Encounters:  09/19/22 6\' 3"  (1.905 m)    Weight:   Wt Readings from Last 1 Encounters:  09/19/22 (!) 148.8 kg    BMI:  Body mass index is 41 kg/m.  Estimated Nutritional Needs:   Kcal:  2300-2500  Protein:  125-140 grams  Fluid:  >2 L/day  Cammy Copa., RD, LDN, CNSC See AMiON for contact information

## 2022-09-19 NOTE — Transfer of Care (Signed)
Immediate Anesthesia Transfer of Care Note  Patient: Marcus Wall  Procedure(s) Performed: RADIOLOGY WITH ANESTHESIA  Patient Location: ICU  Anesthesia Type:General  Level of Consciousness: Patient remains intubated per anesthesia plan  Airway & Oxygen Therapy: Patient remains intubated per anesthesia plan and Patient placed on Ventilator (see vital sign flow sheet for setting)  Post-op Assessment: Report given to RN and Post -op Vital signs reviewed and stable  Post vital signs: Reviewed and stable  Last Vitals:  Vitals Value Taken Time  BP 132/94 09/19/22 0505  Temp 34.9 C 09/19/22 0515  Pulse 87 09/19/22 0515  Resp 18 09/19/22 0515  SpO2 99 % 09/19/22 0515  Vitals shown include unfiled device data.  Last Pain: There were no vitals filed for this visit.       Complications: No notable events documented.

## 2022-09-20 ENCOUNTER — Encounter (HOSPITAL_COMMUNITY): Payer: Self-pay | Admitting: Neurosurgery

## 2022-09-20 ENCOUNTER — Inpatient Hospital Stay (HOSPITAL_COMMUNITY): Payer: Medicaid Other

## 2022-09-20 DIAGNOSIS — I63512 Cerebral infarction due to unspecified occlusion or stenosis of left middle cerebral artery: Secondary | ICD-10-CM

## 2022-09-20 LAB — ECHOCARDIOGRAM COMPLETE
Area-P 1/2: 2.37 cm2
Calc EF: 74.5 %
Height: 75 in
S' Lateral: 2.2 cm
Single Plane A2C EF: 75.8 %
Single Plane A4C EF: 70.4 %
Weight: 5178.16 [oz_av]

## 2022-09-20 LAB — MAGNESIUM
Magnesium: 2.1 mg/dL (ref 1.7–2.4)
Magnesium: 2.3 mg/dL (ref 1.7–2.4)
Magnesium: 2.3 mg/dL (ref 1.7–2.4)

## 2022-09-20 LAB — BASIC METABOLIC PANEL
Anion gap: 8 (ref 5–15)
BUN: 16 mg/dL (ref 8–23)
CO2: 20 mmol/L — ABNORMAL LOW (ref 22–32)
Calcium: 8.6 mg/dL — ABNORMAL LOW (ref 8.9–10.3)
Chloride: 122 mmol/L — ABNORMAL HIGH (ref 98–111)
Creatinine, Ser: 1.09 mg/dL (ref 0.61–1.24)
GFR, Estimated: >60 mL/min (ref >60)
Glucose, Bld: 180 mg/dL — ABNORMAL HIGH (ref 70–99)
Potassium: 4.1 mmol/L (ref 3.5–5.1)
Sodium: 150 mmol/L — ABNORMAL HIGH (ref 135–145)

## 2022-09-20 LAB — GLUCOSE, CAPILLARY
Glucose-Capillary: 207 mg/dL — ABNORMAL HIGH (ref 70–99)
Glucose-Capillary: 197 mg/dL — ABNORMAL HIGH (ref 70–99)
Glucose-Capillary: 191 mg/dL — ABNORMAL HIGH (ref 70–99)
Glucose-Capillary: 171 mg/dL — ABNORMAL HIGH (ref 70–99)
Glucose-Capillary: 161 mg/dL — ABNORMAL HIGH (ref 70–99)
Glucose-Capillary: 160 mg/dL — ABNORMAL HIGH (ref 70–99)

## 2022-09-20 LAB — CBC
HCT: 39.4 % (ref 39.0–52.0)
Hemoglobin: 12.2 g/dL — ABNORMAL LOW (ref 13.0–17.0)
MCH: 27.4 pg (ref 26.0–34.0)
MCHC: 31 g/dL (ref 30.0–36.0)
MCV: 88.5 fL (ref 80.0–100.0)
Platelets: 166 10*3/uL (ref 150–400)
RBC: 4.45 MIL/uL (ref 4.22–5.81)
RDW: 14.4 % (ref 11.5–15.5)
WBC: 14.7 10*3/uL — ABNORMAL HIGH (ref 4.0–10.5)
nRBC: 0 % (ref 0.0–0.2)

## 2022-09-20 LAB — SODIUM
Sodium: 150 mmol/L — ABNORMAL HIGH (ref 135–145)
Sodium: 149 mmol/L — ABNORMAL HIGH (ref 135–145)
Sodium: 153 mmol/L — ABNORMAL HIGH (ref 135–145)
Sodium: 155 mmol/L — ABNORMAL HIGH (ref 135–145)

## 2022-09-20 LAB — TRIGLYCERIDES: Triglycerides: 133 mg/dL (ref <150)

## 2022-09-20 LAB — LACTIC ACID, PLASMA
Lactic Acid, Venous: 2.1 mmol/L (ref 0.5–1.9)
Lactic Acid, Venous: 1.8 mmol/L (ref 0.5–1.9)

## 2022-09-20 LAB — PHOSPHORUS
Phosphorus: 3.7 mg/dL (ref 2.5–4.6)
Phosphorus: 2.8 mg/dL (ref 2.5–4.6)

## 2022-09-20 MED ORDER — SODIUM CHLORIDE 3 % IV SOLN
INTRAVENOUS | Status: DC
  Administered 2022-09-20: 100 mL/h via INTRAVENOUS
  Filled 2022-09-20 (×2): qty 500

## 2022-09-20 MED ORDER — SODIUM CHLORIDE 3 % IV BOLUS
250.0000 mL | Freq: Once | INTRAVENOUS | Status: AC
  Administered 2022-09-20: 250 mL via INTRAVENOUS
  Filled 2022-09-20: qty 500

## 2022-09-20 MED ORDER — ATROPINE SULFATE 1 MG/10ML IJ SOSY
PREFILLED_SYRINGE | INTRAMUSCULAR | Status: AC
  Filled 2022-09-20: qty 10

## 2022-09-20 NOTE — Progress Notes (Addendum)
CT head without contrast reviewed, which showed ongoing hemorrhagic transformation of large hemispheric stroke, which was expected given the intraoperative findings and his revascularization of large infarcted territory . Unfortunately, it does not appear his stroke is survivable. Recommend palliative care.

## 2022-09-20 NOTE — Plan of Care (Signed)
  Problem: Education: Goal: Knowledge of disease or condition will improve Outcome: Not Progressing   Problem: Education: Goal: Knowledge of secondary prevention will improve (MUST DOCUMENT ALL) Outcome: Not Progressing   Problem: Ischemic Stroke/TIA Tissue Perfusion: Goal: Complications of ischemic stroke/TIA will be minimized Outcome: Not Progressing

## 2022-09-20 NOTE — Progress Notes (Signed)
    Providing Compassionate, Quality Care - Together   NEUROSURGERY PROGRESS NOTE     S: NAEs o/n.     O: EXAM:  BP 118/75   Pulse (!) 48   Temp (!) 96 F (35.6 C) (Axillary)   Resp (!) 22   Ht 6\' 3"  (1.905 m)   Wt (!) 146.8 kg   SpO2 96%   BMI 40.45 kg/m   Intubated, sedated L pupil 8mm, R pupil 2mm. Nonreactive.  Negative L corneal. Positive on R.  Negative cough/gag.  No pain response.    ASSESSMENT:  63 y.o. s/p L craniectomy for cerebral edema from ICA infarct, POD#1. CTH showing multifocal severe cerebral edema and areas of herniation.     PLAN: -Continue supportive care. Agree with plan per CCM to discuss goals of care with family today. -Poor prognosis for neurologic recovery.  -Call w/ questions/concerns.   Patrici Ranks, [facility]

## 2022-09-20 NOTE — Progress Notes (Signed)
PT Cancellation Note  Patient Details Name: Marcus Wall MRN: [id number] DOB: [birth year]   Cancelled Treatment:    Reason Eval/Treat Not Completed: (P) Patient not medically ready. Pt currently sedated and intubated. Will plan to follow-up another day as able.   Virgil Benedict, PT, DPT Acute Rehabilitation Services  Office: [phone]    Bettina Gavia 09/20/2022, 8:51 AM

## 2022-09-20 NOTE — Progress Notes (Signed)
  Echocardiogram 2D Echocardiogram has been performed.  Marcus Wall 09/20/2022, 9:58 AM

## 2022-09-20 NOTE — H&P (Signed)
Transition of Care [facility]) - Inpatient Brief Assessment   Patient Details  Name: Marcus Wall MRN: [id number] Date of Birth: [birth year]  Transition of [facility] CM/SW Contact:    Glennon Mac, RN Phone Number: 09/20/2022, 2:15 PM   Clinical Narrative: Patient admitted on 09/19/2022, who admitted with code stroke with right-sided weakness, left gaze preference, vomiting, and right hemianopsia.  Patient status post thrombectomy; CT shows ongoing hemorrhagic transformation of stroke.  Patient has poor prognosis and is now a DNR.  Family has been notified, and are planning to visit this afternoon, per bedside nurse.   Transition of Care Asessment: Insurance and Status: Selfpay Patient has primary care physician: No Home environment has been reviewed: uncertain; patient intubated, and family unavailable Prior level of function:: uncertain; patient intubated, and family unavailable     Readmission risk has been reviewed: Yes Transition of care needs: transition of care needs identified, TOC will continue to follow  Quintella Baton, RN, BSN  Trauma/Neuro ICU Case Manager [phone]

## 2022-09-20 NOTE — Progress Notes (Signed)
PCCM Progress Note  Na 155 @ 5:17 PM Hypertonic saline discontinued at 9:08 PM Repeat ordered for 11 PM Elink to follow-up

## 2022-09-20 NOTE — Progress Notes (Signed)
Pt. Transported from 4N16 to CT and back by RT and RN. No complications. Will continue to monitor.

## 2022-09-20 NOTE — Progress Notes (Signed)
RT NOTE: patient placed on CPAP/PSV 10/5 at 0820.  Currently tolerating well.  Will continue to monitor and wean as tolerated.

## 2022-09-20 NOTE — IPAL (Signed)
  Interdisciplinary Goals of Care Family Meeting   Date carried out: 09/20/2022  Location of the meeting: Phone conference  Member's involved: Physician and Family Member or next of kin  Durable Power of Attorney or acting medical decision maker: Son, Marcus Wall    Discussion: We discussed goals of care for [name] .  Contacted son Marcus Wall this morning. He reports last update was post-op around 7 pm. He is aware that surgery went as planned but unsure if his father would do well after. I have updated him on his critical status and worsening hemodynamics in setting of herniation. I have recommended comfort care as his likelihood of meaningful improvement and survival is low. We discussed futility in CPR but he wishes to keep patient on vent until family can arrive. OK to transition to DNR and continue current care until family arrives.  Code status:   Code Status: Limited: Do not attempt resuscitation (DNR) -DNR-LIMITED -Do Not Intubate/DNI    Disposition: Continue current acute care  Time spent for the meeting: 15 min    Marcus Fragoso Mechele Collin, MD  09/20/2022, 9:13 AM

## 2022-09-20 NOTE — Progress Notes (Signed)
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 S/p left ICA, MCA and ACA revascularization. Patient also now s/p urgent left decompressive hemicraniectomy for cerebral edema.    Subjective: Patient intubated and in critical condition with unstable vital signs. Patient now a DNR. Possible transition comfort care once family has arrived and met with the healthcare team.    Allergies: Patient has no known allergies.  Medications: Prior to Admission medications   Not on File    Vital Signs: BP 137/64   Pulse (!) 51   Temp (!) 96 F (35.6 C) (Axillary)   Resp (!) 23   Ht 6\' 3"  (1.905 m)   Wt (!) 323 lb 10.2 oz (146.8 kg)   SpO2 97%   BMI 40.45 kg/m   Physical Exam HENT:     Head:     Comments: Craniectomy bandages in place  Eyes:     Comments: Pupils unequal and non-reactive   Cardiovascular:     Rate and Rhythm: Bradycardia present.  Pulmonary:     Comments: Intubated/sedated    Imaging: CT HEAD WO CONTRAST ( )  Result Date: 09/20/2022 CLINICAL DATA:  Intracranial hemorrhage EXAM: CT HEAD WITHOUT CONTRAST TECHNIQUE: Contiguous axial images were obtained from the base of the skull through the vertex without intravenous contrast. RADIATION DOSE REDUCTION: This exam was performed according to the departmental dose-optimization program which includes automated exposure control, adjustment of the mA and/or kV according to patient size and/or use of iterative reconstruction technique. COMPARISON:  09/19/2022 FINDINGS: Brain: Now status post decompressive left hemi craniectomy with large amount of the left hemisphere herniating through the craniectomy defect. There is a large amount of subarachnoid blood over the left hemisphere. There is an intraparenchymal component measuring approximately 3.5 x 2.5 cm. There is subfalcine  herniation of the left cingulate gyrus and rightward midline shift of approximately 19 mm. There is downward transtentorial herniation of the left uncus. Entrapment of the posterior right lateral ventricle with mild hydrocephalus. The left lateral ventricle is effaced. Widespread cytotoxic edema throughout the left hemisphere with relative sparing of the PCA territory. Vascular: Limited visualization due to the degree of edema. Skull: Left decompressive hemi craniectomy with large amount of overlying soft tissue swelling and left parietal scalp hematoma. Sinuses/Orbits: Paranasal sinuses are clear.  Normal orbits. Other: None IMPRESSION: 1. Now status post decompressive left hemicraniectomy with large amount of the left hemisphere herniating through the craniectomy defect. 2. Large amount of subarachnoid blood over the left hemisphere with an intraparenchymal component measuring approximately 3.5 x 2.5 cm. 3. Subfalcine and uncal herniation rightward midline shift of approximately 19 mm. 4. Entrapment of the posterior right lateral ventricle with mild hydrocephalus. 5. Widespread cytotoxic edema throughout the left hemisphere with relative sparing of the PCA territory. These results were communicated to Dr. Erick Blinks at 3:24 am on 09/20/2022 by text page via the [facility] messaging system. Electronically Signed   By: Deatra Robinson M.D.   On: 09/20/2022 03:25   MR BRAIN WO CONTRAST  Result Date: 09/19/2022 CLINICAL DATA:  Neuro deficit, acute, stroke suspected. Left MCA stroke status post intervention. EXAM: MRI HEAD WITHOUT CONTRAST TECHNIQUE: Multiplanar, multiecho pulse sequences of the brain and surrounding structures were obtained without intravenous contrast. COMPARISON:  CT studies earlier same day FINDINGS: Brain: The brainstem and cerebellum are normal. The right cerebral hemisphere is normal.  There is acute infarction affecting the left frontal cortical brain, superior left temporal lobe, and left  parietal lobe. Left basal ganglia are also affected. The infarcted brain shows some swelling with left-to-right shift of 2 mm. Very minimal petechial blood products evident in the distal left ACA territory. No measurable hematoma. No hydrocephalus. No extra-axial collection. Vascular: Flow present within the major vessels at the base of the brain currently. Skull and upper cervical spine: Negative Sinuses/Orbits: Clear/normal Other: None IMPRESSION: Acute infarction affecting the left frontal cortical brain, superior left temporal lobe and left parietal lobe. Infarction of the left basal ganglia. Mild swelling with left-to-right shift of 2 mm. Minimal petechial blood products in the distal left ACA territory. No measurable hematoma. Electronically Signed   By: Paulina Fusi M.D.   On: 09/19/2022 15:34   DG Abd 1 View  Result Date: 09/19/2022 CLINICAL DATA:  Orogastric tube placement. EXAM: ABDOMEN - 1 VIEW COMPARISON:  None Available. FINDINGS: Distal tip of nasogastric tube is seen in expected position of proximal stomach. IMPRESSION: Distal tip of nasogastric tube is seen in expected position of proximal stomach. Electronically Signed   By: Lupita Raider M.D.   On: 09/19/2022 15:31   DG CHEST PORT 1 VIEW  Result Date: 09/19/2022 CLINICAL DATA:  Central line placement. EXAM: PORTABLE CHEST 1 VIEW COMPARISON:  Same day. FINDINGS: Normal cardiac size. Endotracheal and nasogastric tubes are in grossly good position. Interval placement of right internal jugular catheter with distal tip in expected position of SVC. Right upper lobe airspace opacity is noted concerning for pneumonia or atelectasis. Left lung is clear. IMPRESSION: Interval placement of right internal jugular catheter with distal tip in expected position of SVC. Continued right upper lobe airspace opacity consistent with pneumonia or atelectasis. Electronically Signed   By: Lupita Raider M.D.   On: 09/19/2022 15:30   VAS US CAROTID  Result  Date: 09/19/2022 Carotid Arterial Duplex Study Patient Name:  MIZAEL GRIESMER [facility]  Date of Exam:   09/19/2022 Medical Rec #: [id number]               Accession #:    [id number] Date of Birth: November 19, 1960               Patient Gender: M Patient Age:   89 years Exam Location:  [facility] Procedure:      VAS US CAROTID Referring Phys: Terrilee Files [facility] --------------------------------------------------------------------------------  Indications:       CVA. Risk Factors:      Hypertension. Limitations        Today's exam was limited due to a central line. Right side w/                    bandaging. Comparison Study:  No prior studies. Performing Technologist: Jean Rosenthal RDMS, RVT  Examination Guidelines: A complete evaluation includes B-mode imaging, spectral Doppler, color Doppler, and power Doppler as needed of all accessible portions of each vessel. Bilateral testing is considered an integral part of a complete examination. Limited examinations for reoccurring indications may be performed as noted.  Right Carotid Findings: +----------+--------+--------+--------+------------------+---------------------+           PSV cm/sEDV cm/sStenosisPlaque DescriptionComments              +----------+--------+--------+--------+------------------+---------------------+ CCA Prox  Not visualized- see                                                       limitations           +----------+--------+--------+--------+------------------+---------------------+ CCA Distal56      10                                intimal thickening    +----------+--------+--------+--------+------------------+---------------------+ ICA Prox  33      9                                                       +----------+--------+--------+--------+------------------+---------------------+ ICA Distal38      10                                                       +----------+--------+--------+--------+------------------+---------------------+ ECA       51      11                                                      +----------+--------+--------+--------+------------------+---------------------+ +----------+--------+-------+----------------+-------------------+           PSV cm/sEDV cmsDescribe        Arm Pressure (mmHG) +----------+--------+-------+----------------+-------------------+ [id number]            Multiphasic, WNL                    +----------+--------+-------+----------------+-------------------+ +---------+--------+--+--------+-+---------+ VertebralPSV cm/s25EDV cm/s5Antegrade +---------+--------+--+--------+-+---------+  Left Carotid Findings: +----------+--------+--------+--------+------------------+------------------+           PSV cm/sEDV cm/sStenosisPlaque DescriptionComments           +----------+--------+--------+--------+------------------+------------------+ CCA Prox  152     24                                                   +----------+--------+--------+--------+------------------+------------------+ CCA Distal43      12                                intimal thickening +----------+--------+--------+--------+------------------+------------------+ ICA Prox  60      20      1-39%   heterogenous mild                    +----------+--------+--------+--------+------------------+------------------+ ICA Distal69      29                                                   +----------+--------+--------+--------+------------------+------------------+  ECA       47      9                                                    +----------+--------+--------+--------+------------------+------------------+ +----------+--------+--------+----------------+-------------------+           PSV cm/sEDV cm/sDescribe        Arm Pressure (mmHG)  +----------+--------+--------+----------------+-------------------+ [id number]             Multiphasic, WNL                    +----------+--------+--------+----------------+-------------------+ +---------+--------+--+--------+--+---------+ VertebralPSV cm/s53EDV cm/s12Antegrade +---------+--------+--+--------+--+---------+   Summary: Right Carotid: The extracranial vessels were near-normal with only minimal wall                thickening or plaque. Left Carotid: Velocities in the left ICA are consistent with a 1-39% stenosis. Vertebrals:  Bilateral vertebral arteries demonstrate antegrade flow. Subclavians: Normal flow hemodynamics were seen in bilateral subclavian              arteries. *See table(s) above for measurements and observations.     Preliminary    DG CHEST PORT 1 VIEW  Result Date: 09/19/2022 CLINICAL DATA:  62 year old male left MCA stroke.  Intubated. EXAM: PORTABLE CHEST 1 VIEW COMPARISON:  Portable chest 09/19/2022 and earlier. FINDINGS: Portable AP semi upright view at 0541 hours. Endotracheal tube position not significantly changed, at the lower clavicles now. Interval right upper lung volume loss and dense opacification, likely upper lobe collapse. Some air bronchograms there. Enteric tube looped in the left upper quadrant. Stable cardiac size and mediastinal contours. Lung ventilation elsewhere within normal limits. No pneumothorax or pleural effusion identified. No acute osseous abnormality identified. Negative visible bowel gas. IMPRESSION: 1.  Stable lines and tubes. 2. But new right upper lobe collapse since 0236 hours. Recommend pulmonary toilet and repeat portable chest x-ray. These results will be called to the ordering clinician or representative by the Radiologist Assistant, and communication documented in the PACS or [employer]. Electronically Signed   By: Odessa Fleming M.D.   On: 09/19/2022 06:37   DG Chest Portable 1 View  Result Date: 09/19/2022 CLINICAL  DATA:  Endotracheal tube placement. EXAM: PORTABLE CHEST 1 VIEW COMPARISON:  05/02/2017. FINDINGS: The heart size and mediastinal contours are within normal limits. Lung volumes are low. No consolidation, effusion, or pneumothorax. An endotracheal tube terminates 3.6 cm above the carina. An enteric tube is seen over the proximal to mid esophagus and not visualized distal to this point. No acute osseous abnormality. IMPRESSION: 1. No active disease. 2. Endotracheal tube terminates 3.6 cm above the carina. 3. Enteric tube is seen over the proximal and mid esophagus and is not visualized distal to this point. Additional views are recommended. Electronically Signed   By: Thornell Sartorius M.D.   On: 09/19/2022 04:26   CT CEREBRAL PERFUSION W CONTRAST  Result Date: 09/19/2022 CLINICAL DATA:  Left MCA stroke EXAM: CT PERFUSION BRAIN TECHNIQUE: Multiphase CT imaging of the brain was performed following IV bolus contrast injection. Subsequent parametric perfusion maps were calculated using RAPID software. RADIATION DOSE REDUCTION: This exam was performed according to the departmental dose-optimization program which includes automated exposure control, adjustment of the mA and/or kV according to patient size and/or use of iterative reconstruction technique. CONTRAST:  45mL OMNIPAQUE IOHEXOL  350 MG/ML SOLN COMPARISON:  CTA head neck same day FINDINGS: CT Brain Perfusion Findings: CBF (<30%) Volume: Perfusion (Tmax>6.0s) volume: Mismatch Volume: 86mL ASPECTS on noncontrast CT Head: 8 at 2:28 a.m. today. Infarct Core: 236 mL is reported, which includes nearly the entire left anterior and middle cerebral artery territories. On further review of the CTA images, there is diminished opacification in the distal A2 segment of the left anterior cerebral artery (series 11, image 38, 39 of that examination). Mismatch location:Predominantly within the posterior left temporal lobe. Technical notes: Mild translational motion  during the examination. IMPRESSION: 1. A 236 mL core infarct involving nearly the entire left anterior and middle cerebral artery territories. 2. An 86 mL calculated penumbra, predominantly within the posterior left temporal lobe. 3. On further review of the CTA images, there is diminished opacification in the distal A2 segment of the left anterior cerebral artery, though it is widely patent proximally. These results were communicated to Dr. Erick Blinks at 3:23 am on 09/19/2022 by text page via the [facility] messaging system. Electronically Signed   By: Deatra Robinson M.D.   On: 09/19/2022 03:23   CT ANGIO HEAD NECK W WO CM (CODE STROKE)  Result Date: 09/19/2022 CLINICAL DATA:  Weakness EXAM: CT ANGIOGRAPHY HEAD AND NECK WITH AND WITHOUT CONTRAST TECHNIQUE: Multidetector CT imaging of the head and neck was performed using the standard protocol during bolus administration of intravenous contrast. Multiplanar CT image reconstructions and MIPs were obtained to evaluate the vascular anatomy. Carotid stenosis measurements (when applicable) are obtained utilizing NASCET criteria, using the distal internal carotid diameter as the denominator. RADIATION DOSE REDUCTION: This exam was performed according to the departmental dose-optimization program which includes automated exposure control, adjustment of the mA and/or kV according to patient size and/or use of iterative reconstruction technique. CONTRAST:  75mL OMNIPAQUE IOHEXOL 350 MG/ML SOLN COMPARISON:  None Available. FINDINGS: CTA NECK FINDINGS SKELETON: There is no bony spinal canal stenosis. No lytic or blastic lesion. OTHER NECK: Normal pharynx, larynx and major salivary glands. No cervical lymphadenopathy. Unremarkable thyroid gland. UPPER CHEST: No pneumothorax or pleural effusion. No nodules or masses. AORTIC ARCH: There is no calcific atherosclerosis of the aortic arch. Conventional 3 vessel aortic branching pattern. RIGHT CAROTID SYSTEM: Normal without  aneurysm, dissection or stenosis. LEFT CAROTID SYSTEM: Left common carotid artery is patent and of normal caliber. The left ICA is occluded at its origin and remains occluded along its entire course. VERTEBRAL ARTERIES: Left dominant configuration.There is short segment loss of opacification of the right V2 segment at the level of the C3 vertebra (series 5, image 211). This may be artifactual, as the remainder of the right vertebral artery is entirely normal. CTA HEAD FINDINGS POSTERIOR CIRCULATION: --Vertebral arteries: Normal V4 segments. --Inferior cerebellar arteries: Normal. --Basilar artery: Normal. --Superior cerebellar arteries: Normal. --Posterior cerebral arteries (PCA): Normal. Both posterior communicating arteries are patent. ANTERIOR CIRCULATION: --Intracranial internal carotid arteries: Normal. --Anterior cerebral arteries (ACA): The left A1 segment is non-opacified, but otherwise the anterior cerebral arteries are normal. --Middle cerebral arteries (MCA): The left MCA is completely occluded with essentially no collateral blood flow demonstrated in the MCA territory. The right MCA is normal. VENOUS SINUSES: As permitted by contrast timing, patent. Incidental areas of fat attenuation of the inter hemispheric fissure ANATOMIC VARIANTS: None Review of the MIP images confirms the above findings. IMPRESSION: 1. 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 2. Complete occlusion of the left MCA  with essentially no collateral blood flow in the MCA territory. 3. Short segment loss of opacification of the right V2 segment at the level of the C3 vertebra, which may be artifactual, as the remainder of the right vertebral artery is entirely normal. Critical Value/emergent results were called by telephone at the time of interpretation on 09/19/2022 at 2:45 am to provider [facility] , who verbally acknowledged these results. Electronically Signed   By: Deatra Robinson M.D.   On:  09/19/2022 02:50   CT HEAD CODE STROKE WO CONTRAST  Result Date: 09/19/2022 CLINICAL DATA:  Code stroke.  Weakness EXAM: CT HEAD WITHOUT CONTRAST TECHNIQUE: Contiguous axial images were obtained from the base of the skull through the vertex without intravenous contrast. RADIATION DOSE REDUCTION: This exam was performed according to the departmental dose-optimization program which includes automated exposure control, adjustment of the mA and/or kV according to patient size and/or use of iterative reconstruction technique. COMPARISON:  None Available. FINDINGS: Brain: There is no mass, hemorrhage or extra-axial collection. The size and configuration of the ventricles and extra-axial CSF spaces are normal. The brain parenchyma is normal, without evidence of acute or chronic infarction. Vascular: Hyperdense left MCA M1 segment Skull: The visualized skull base, calvarium and extracranial soft tissues are normal. Sinuses/Orbits: No fluid levels or advanced mucosal thickening of the visualized paranasal sinuses. No mastoid or middle ear effusion. The orbits are normal. ASPECTS (Alberta Stroke Program Early CT Score) - Ganglionic level infarction (caudate, lentiform nuclei, internal capsule, insula, M1-M3 cortex): 5 (abnormal insula and left lentiform nucleus) - Supraganglionic infarction (M4-M6 cortex): 3 Total score (0-10 with 10 being normal): 8 IMPRESSION: 1. No intracranial hemorrhage. 2. Hyperdense left MCA M1 segment. 3. ASPECTS is 8. Dr. Erick Blinks at 2:31 am on 09/19/2022 by text page via the [facility] messaging system. Electronically Signed   By: Deatra Robinson M.D.   On: 09/19/2022 02:40    Labs:  CBC: Recent Labs    09/19/22 0245 09/19/22 0251 09/19/22 0529 09/20/22 0833  WBC  --  6.1 5.8 14.7*  HGB 15.0 15.0 14.8 12.2*  HCT 44.0 45.3 45.1 39.4  PLT  --  PLATELET CLUMPS NOTED ON SMEAR, UNABLE TO ESTIMATE 195 166    COAGS: Recent Labs    09/19/22 0529  INR 1.0  APTT 30     BMP: Recent Labs    09/19/22 0245 09/19/22 0529 09/19/22 1154 09/19/22 1849 09/20/22 0128 09/20/22 0558 09/20/22 0833  NA 136 135   < > 144 150* 149* 150*  K 6.0* 4.2  --   --   --   --  4.1  CL 102 104  --   --   --   --  122*  CO2  --  24  --   --   --   --  20*  GLUCOSE 188* 143*  --   --   --   --  180*  BUN 25* 14  --   --   --   --  16  CALCIUM  --  8.7*  --   --   --   --  8.6*  CREATININE 1.20 1.01  --   --   --   --  1.09  GFRNONAA  --  >60  --   --   --   --  >60   < > = values in this interval not displayed.    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 S/p left ICA, MCA and ACA revascularization. Patient also now s/p urgent left decompressive hemicraniectomy for cerebral edema.    CT head this morning shows brain herniation. Patient with poor prognosis and is now DNR. Critical care team waiting for family to arrive for possible transition to comfort care.    Electronically Signed: Alwyn Ren, AGACNP-BC [phone] 09/20/2022, 9:49 AM   I spent a total of 15 Minutes at the the patient's bedside AND on the patient's hospital floor or unit, greater than 50% of which was counseling/coordinating care for post stroke care.

## 2022-09-20 NOTE — Progress Notes (Signed)
2D echo attempted, patient getting chest PT therapy. Will try later.

## 2022-09-20 NOTE — Progress Notes (Addendum)
STROKE TEAM PROGRESS NOTE   BRIEF HPI [name] is a 62 y.o. male with history of hypertension who presented as a code stroke due to right-sided weakness, left gaze preference, vomiting, and right hemianopsia.  Patient's last known well was 8 PM 9/9.  He was then found around 1:42 AM.  Friend states he saw him hanging off the bed, making noises, vomiting, moving only his left side.  Per EMS entrance port patient's heart rate would drop to 30s and required intermittent pacing.  He was also hypertensive up to the 200s systolic. CT head with hyperdense left MCA with developing left MCA stroke.  CTA with occlusion of the left ICA at bifurcation, occluded all the way to left MCA.  SIGNIFICANT HOSPITAL EVENTS 9/9: Taken for thrombectomy of L ACA A1, MCA Mi with TICI 3 revascularization  INTERIM HISTORY/SUBJECTIVE Patient remains sedated and intubated.  Unresponsive, on no sedation. Sedation has been off since this morning  He is on 3% saline @ 100cc/hr, last NA 149.   Repeat CT head this morning status post decompressive left hemicraniectomy with large amount of the left hemisphere herniating through the craniectomy defect. Large amount of subarachnoid blood over the left hemisphere with an intraparenchymal component measuring approximately 3.5 x 2.5 cm. Subfalcine and uncal herniation rightward midline shift of approximately 19 mm. Entrapment of the posterior right lateral ventricle with mild hydrocephalus. Widespread cytotoxic edema throughout the left hemisphere with relative sparing of the PCA territory.  Very poor neurological exam, left pupil blown, bilateral corneal reflex intact, no cough or gag but biting on tube, breathing over the vent  OBJECTIVE  CBC    Component Value Date/Time   WBC 14.7 (H) 09/20/2022 0833   RBC 4.45 09/20/2022 0833   HGB 12.2 (L) 09/20/2022 0833   HCT 39.4 09/20/2022 0833   PLT 166 09/20/2022 0833   MCV 88.5 09/20/2022 0833   MCH 27.4  09/20/2022 0833   MCHC 31.0 09/20/2022 0833   RDW 14.4 09/20/2022 0833   LYMPHSABS 1.0 09/19/2022 0529   MONOABS 0.3 09/19/2022 0529   EOSABS 0.0 09/19/2022 0529   BASOSABS 0.0 09/19/2022 0529    BMET    Component Value Date/Time   NA 150 (H) 09/20/2022 0833   K 4.1 09/20/2022 0833   CL 122 (H) 09/20/2022 0833   CO2 20 (L) 09/20/2022 0833   GLUCOSE 180 (H) 09/20/2022 0833   BUN 16 09/20/2022 0833   CREATININE 1.09 09/20/2022 0833   CALCIUM 8.6 (L) 09/20/2022 0833   GFRNONAA >60 09/20/2022 0833    IMAGING past 24 hours ECHOCARDIOGRAM COMPLETE  Result Date: 09/20/2022    ECHOCARDIOGRAM REPORT   Patient Name:   Marcus Wall [facility] Date of Exam: 09/20/2022 Medical Rec #:  [id number]              Height:       75.0 in Accession #:    [id number]             Weight:       323.6 lb Date of Birth:  02/06/1960              BSA:          2.692 m Patient Age:    62 years               BP:           118/75 mmHg Patient Gender: M  HR:           38 bpm. Exam Location:  Inpatient Procedure: 2D Echo, Cardiac Doppler and Color Doppler Indications:    Stroke  History:        Patient has no prior history of Echocardiogram examinations.                 Stroke. Large left ICA infarct.  Sonographer:    Sheralyn Boatman RDCS Referring Phys: [id number] [facility]  Sonographer Comments: Echo performed with patient supine and on artificial respirator. IMPRESSIONS  1. Left ventricular ejection fraction, by estimation, is 70 to 75%. The left ventricle has hyperdynamic function. The left ventricle has no regional wall motion abnormalities. There is mild left ventricular hypertrophy. Left ventricular diastolic parameters were normal.  2. Right ventricular systolic function is normal. The right ventricular size is moderately enlarged. Tricuspid regurgitation signal is inadequate for assessing PA pressure.  3. The mitral valve is normal in structure. No evidence of mitral valve regurgitation. No evidence  of mitral stenosis.  4. The aortic valve was not well visualized. Aortic valve regurgitation is not visualized. No aortic stenosis is present.  5. The inferior vena cava is normal in size with greater than 50% respiratory variability, suggesting right atrial pressure of 3 mmHg. Comparison(s): No prior Echocardiogram. Conclusion(s)/Recommendation(s): No intracardiac source of embolism detected on this transthoracic study. Consider a transesophageal echocardiogram to exclude cardiac source of embolism if clinically indicated. FINDINGS  Left Ventricle: Left ventricular ejection fraction, by estimation, is 70 to 75%. The left ventricle has hyperdynamic function. The left ventricle has no regional wall motion abnormalities. The left ventricular internal cavity size was normal in size. There is mild left ventricular hypertrophy. Left ventricular diastolic parameters were normal. Right Ventricle: The right ventricular size is moderately enlarged. No increase in right ventricular wall thickness. Right ventricular systolic function is normal. Tricuspid regurgitation signal is inadequate for assessing PA pressure. Left Atrium: Left atrial size was normal in size. Right Atrium: Right atrial size was normal in size. Pericardium: There is no evidence of pericardial effusion. Mitral Valve: The mitral valve is normal in structure. No evidence of mitral valve regurgitation. No evidence of mitral valve stenosis. Tricuspid Valve: The tricuspid valve is normal in structure. Tricuspid valve regurgitation is not demonstrated. No evidence of tricuspid stenosis. Aortic Valve: The aortic valve was not well visualized. Aortic valve regurgitation is not visualized. No aortic stenosis is present. Pulmonic Valve: The pulmonic valve was normal in structure. Pulmonic valve regurgitation is not visualized. No evidence of pulmonic stenosis. Aorta: The aortic root and ascending aorta are structurally normal, with no evidence of dilitation. Venous:  The inferior vena cava is normal in size with greater than 50% respiratory variability, suggesting right atrial pressure of 3 mmHg. IAS/Shunts: No atrial level shunt detected by color flow Doppler.  LEFT VENTRICLE PLAX 2D LVIDd:         4.50 cm      Diastology LVIDs:         2.20 cm      LV e' medial:    7.83 cm/s LV PW:         1.40 cm      LV E/e' medial:  11.6 LV IVS:        1.30 cm      LV e' lateral:   11.90 cm/s LVOT diam:     2.50 cm      LV E/e' lateral: 7.6 LV SV:  167 LV SV Index:   62 LVOT Area:     4.91 cm  LV Volumes (MOD) LV vol d, MOD A2C: 140.0 ml LV vol d, MOD A4C: 141.0 ml LV vol s, MOD A2C: 33.9 ml LV vol s, MOD A4C: 41.8 ml LV SV MOD A2C:     106.1 ml LV SV MOD A4C:     141.0 ml LV SV MOD BP:      110.9 ml RIGHT VENTRICLE             IVC RV S prime:     17.20 cm/s  IVC diam: 2.00 cm TAPSE (M-mode): 2.8 cm LEFT ATRIUM             Index        RIGHT ATRIUM           Index LA diam:        2.70 cm 1.00 cm/m   RA Area:     16.50 cm LA Vol (A2C):   67.4 ml 25.03 ml/m  RA Volume:   40.20 ml  14.93 ml/m LA Vol (A4C):   59.8 ml 22.21 ml/m LA Biplane Vol: 62.7 ml 23.29 ml/m  AORTIC VALVE LVOT Vmax:   149.00 cm/s LVOT Vmean:  85.900 cm/s LVOT VTI:    0.340 m  AORTA Ao Root diam: 4.00 cm Ao Asc diam:  3.70 cm MITRAL VALVE MV Area (PHT): 2.37 cm    SHUNTS MV Decel Time: 320 msec    Systemic VTI:  0.34 m MV E velocity: 90.90 cm/s  Systemic Diam: 2.50 cm MV A velocity: 85.90 cm/s MV E/A ratio:  1.06 Photographer signed by Carolan Clines Signature Date/Time: 09/20/2022/10:55:53 AM    Final    CT HEAD WO CONTRAST ( )  Result Date: 09/20/2022 CLINICAL DATA:  Intracranial hemorrhage EXAM: CT HEAD WITHOUT CONTRAST TECHNIQUE: Contiguous axial images were obtained from the base of the skull through the vertex without intravenous contrast. RADIATION DOSE REDUCTION: This exam was performed according to the departmental dose-optimization program which includes automated exposure control,  adjustment of the mA and/or kV according to patient size and/or use of iterative reconstruction technique. COMPARISON:  09/19/2022 FINDINGS: Brain: Now status post decompressive left hemi craniectomy with large amount of the left hemisphere herniating through the craniectomy defect. There is a large amount of subarachnoid blood over the left hemisphere. There is an intraparenchymal component measuring approximately 3.5 x 2.5 cm. There is subfalcine herniation of the left cingulate gyrus and rightward midline shift of approximately 19 mm. There is downward transtentorial herniation of the left uncus. Entrapment of the posterior right lateral ventricle with mild hydrocephalus. The left lateral ventricle is effaced. Widespread cytotoxic edema throughout the left hemisphere with relative sparing of the PCA territory. Vascular: Limited visualization due to the degree of edema. Skull: Left decompressive hemi craniectomy with large amount of overlying soft tissue swelling and left parietal scalp hematoma. Sinuses/Orbits: Paranasal sinuses are clear.  Normal orbits. Other: None IMPRESSION: 1. Now status post decompressive left hemicraniectomy with large amount of the left hemisphere herniating through the craniectomy defect. 2. Large amount of subarachnoid blood over the left hemisphere with an intraparenchymal component measuring approximately 3.5 x 2.5 cm. 3. Subfalcine and uncal herniation rightward midline shift of approximately 19 mm. 4. Entrapment of the posterior right lateral ventricle with mild hydrocephalus. 5. Widespread cytotoxic edema throughout the left hemisphere with relative sparing of the PCA territory. These results were communicated to Dr. Erick Blinks at [facility]  am on 09/20/2022 by text page via the [facility] messaging system. Electronically Signed   By: Deatra Robinson M.D.   On: 09/20/2022 03:25   DG Abd 1 View  Result Date: 09/19/2022 CLINICAL DATA:  Orogastric tube placement. EXAM: ABDOMEN - 1 VIEW  COMPARISON:  None Available. FINDINGS: Distal tip of nasogastric tube is seen in expected position of proximal stomach. IMPRESSION: Distal tip of nasogastric tube is seen in expected position of proximal stomach. Electronically Signed   By: Lupita Raider M.D.   On: 09/19/2022 15:31   DG CHEST PORT 1 VIEW  Result Date: 09/19/2022 CLINICAL DATA:  Central line placement. EXAM: PORTABLE CHEST 1 VIEW COMPARISON:  Same day. FINDINGS: Normal cardiac size. Endotracheal and nasogastric tubes are in grossly good position. Interval placement of right internal jugular catheter with distal tip in expected position of SVC. Right upper lobe airspace opacity is noted concerning for pneumonia or atelectasis. Left lung is clear. IMPRESSION: Interval placement of right internal jugular catheter with distal tip in expected position of SVC. Continued right upper lobe airspace opacity consistent with pneumonia or atelectasis. Electronically Signed   By: Lupita Raider M.D.   On: 09/19/2022 15:30   VAS US CAROTID  Result Date: 09/19/2022 Carotid Arterial Duplex Study Patient Name:  BEAUMONT WHITENIGHT [facility]  Date of Exam:   09/19/2022 Medical Rec #: [id number]               Accession #:    [id number] Date of Birth: 1960-04-08               Patient Gender: M Patient Age:   56 years Exam Location:  [facility] Procedure:      VAS US CAROTID Referring Phys: Terrilee Files [facility] --------------------------------------------------------------------------------  Indications:       CVA. Risk Factors:      Hypertension. Limitations        Today's exam was limited due to a central line. Right side w/                    bandaging. Comparison Study:  No prior studies. Performing Technologist: Jean Rosenthal RDMS, RVT  Examination Guidelines: A complete evaluation includes B-mode imaging, spectral Doppler, color Doppler, and power Doppler as needed of all accessible portions of each vessel. Bilateral testing is considered an integral part of a  complete examination. Limited examinations for reoccurring indications may be performed as noted.  Right Carotid Findings: +----------+--------+--------+--------+------------------+---------------------+           PSV cm/sEDV cm/sStenosisPlaque DescriptionComments              +----------+--------+--------+--------+------------------+---------------------+ CCA Prox                                            Not visualized- see                                                       limitations           +----------+--------+--------+--------+------------------+---------------------+ CCA Distal56      10  intimal thickening    +----------+--------+--------+--------+------------------+---------------------+ ICA Prox  33      9                                                       +----------+--------+--------+--------+------------------+---------------------+ ICA Distal38      10                                                      +----------+--------+--------+--------+------------------+---------------------+ ECA       51      11                                                      +----------+--------+--------+--------+------------------+---------------------+ +----------+--------+-------+----------------+-------------------+           PSV cm/sEDV cmsDescribe        Arm Pressure (mmHG) +----------+--------+-------+----------------+-------------------+ [id number]            Multiphasic, WNL                    +----------+--------+-------+----------------+-------------------+ +---------+--------+--+--------+-+---------+ VertebralPSV cm/s25EDV cm/s5Antegrade +---------+--------+--+--------+-+---------+  Left Carotid Findings: +----------+--------+--------+--------+------------------+------------------+           PSV cm/sEDV cm/sStenosisPlaque DescriptionComments            +----------+--------+--------+--------+------------------+------------------+ CCA Prox  152     24                                                   +----------+--------+--------+--------+------------------+------------------+ CCA Distal43      12                                intimal thickening +----------+--------+--------+--------+------------------+------------------+ ICA Prox  60      20      1-39%   heterogenous mild                    +----------+--------+--------+--------+------------------+------------------+ ICA Distal69      29                                                   +----------+--------+--------+--------+------------------+------------------+ ECA       47      9                                                    +----------+--------+--------+--------+------------------+------------------+ +----------+--------+--------+----------------+-------------------+           PSV cm/sEDV cm/sDescribe        Arm Pressure (mmHG) +----------+--------+--------+----------------+-------------------+ [id number]  Multiphasic, WNL                    +----------+--------+--------+----------------+-------------------+ +---------+--------+--+--------+--+---------+ VertebralPSV cm/s53EDV cm/s12Antegrade +---------+--------+--+--------+--+---------+   Summary: Right Carotid: The extracranial vessels were near-normal with only minimal wall                thickening or plaque. Left Carotid: Velocities in the left ICA are consistent with a 1-39% stenosis. Vertebrals:  Bilateral vertebral arteries demonstrate antegrade flow. Subclavians: Normal flow hemodynamics were seen in bilateral subclavian              arteries. *See table(s) above for measurements and observations.     Preliminary     Vitals:   09/20/22 0822 09/20/22 0900 09/20/22 1000 09/20/22 1200  BP: 137/64     Pulse: (!) 51 (!) 41 (!) 37   Resp: (!) 23 (!) 23 (!) 23   Temp:    (!) 97.5  F (36.4 C)  TempSrc:    Axillary  SpO2: 97% 96% 96%   Weight:      Height:        PHYSICAL EXAM General: Critically ill middle-aged African-American male intubated and sedated CV: Regular rate and rhythm on monitor Respiratory: Mechanically ventilated GI: Abdomen soft and nontender Head covered with craniotomy bandage  NEURO:  Patient intubated on no sedation.  Eyes are closed.  Unresponsive.  Aphasic and not following commands.  Pupils left pupil blown, right small 2 mm nonreactive.  Bilateral corneal reflex intact, no cough or gag but biting on tube, breathing over the vent.  Does not open eyes to voice or pain. Withdraws to pain in left arm and bilateral lower extremities.  No withdrawal seen in right arm.  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  S/p mechanical thrombectomy with TICI 3 revaularization with subsequent hemorrhagic transformation s/p decompressive craniotomy  Code Stroke CT head  No intracranial hemorrhage Hyperdense left MCA M1 segment Aspects is 8 CTA head & neck w/perfusion Complete occlusion of the left ICA Complete occlusion of the left MCA with essentially no collateral blood flow CT perfusion  236 mm core infarct involving nearly the entire left ACA and MCA territories 86 mL calculated penumbra Post IR CT: No hemorrhage MRI   Acute infarction affecting the left frontal cortical, superior left temporal and left parietal lobe. Infarction of the left basal ganglia Mild swelling with left-to-right shift of 2 mm Minimal petechial blood products in distal left ACA territory  Repeat CT head 09/20/2022 large amount of the left hemisphere herniating through the craniectomy defect. Large amount of subarachnoid blood over the left hemisphere with an intraparenchymal component measuring approximately 3.5 x 2.5 cm. Subfalcine and uncal herniation rightward midline shift of approximately  19 mm. Entrapment of the posterior right lateral ventricle with mild hydrocephalus. Widespread cytotoxic edema throughout the left hemisphere with relative sparing of the PCA territory. 2D Echo 70-75%. left ventricular hypertrophy  Carotid US negative   LDL 109 HgbA1c 6.7 VTE prophylaxis - SCDs No antithrombotic prior to admission, now on No antithrombotic pending MRI, then held due to surgery Therapy recommendations:  pending Disposition:  pending  Cerebral Edema Brain compression S/p left decompressive hemicraniotomy  250 mL bolus of 3% given Continuous 3% 100 mL an hour. Last NA 149 Sodium goal 150-155 Q6 NA checks Na 135 -> 138 Neurosurgery consult Taken for urgent left hemicraniectomy 9/10  Acute respiratory failure  Post procedure Vent management CCM management, appreciate assistance No SBT indication today Continue full vent support VAP bundle  Hypertensive Emergency Home meds:  none Hypertensive into the 200s with EMS and on arrival  Cleviprex gtt, wean as able IVP PRNs added to assist with BP control Unstable Blood Pressure Goal: SBP between 130-150 for 24 hours and then less than 160   Hyperlipidemia Home meds:  none LDL 109, goal < 70 Add Lipitor 80mg   Continue statin at discharge  Diabetes type II, prediabetic Home meds: None HgbA1c 6.7, goal < 7.0 CBGs SSI Recommend close follow-up with PCP  Tobacco Abuse Patient smokes cigars  Dysphagia Patient has post-stroke dysphagia, SLP consulted    Diet   Diet NPO time specified   Advance diet as tolerated  Other Stroke Risk Factors Obesity, Body mass index is 40.45 kg/m., BMI >/= 30 associated with increased stroke risk, recommend weight loss, diet and exercise as appropriate   Other Active Problems Hypocalcemia Replete, continue to monitor Leukocytosis 14.7, afebrile monitor   Hospital day # 1  Gevena Mart DNP, ACNPC-AG  [facility]  I have personally obtained  history,examined this patient, reviewed notes, independently viewed imaging studies, participated in medical decision making and plan of care.ROS completed by me personally and pertinent positives fully documented  I have made any additions or clarifications directly to the above note. Agree with note above.  Patient unfortunately is not doing well despite mechanical thrombectomy being performed successfully he already had significant large core infarct and due to progressive cytotoxic edema required emergent craniotomy but developed hemorrhagic transformation and has extensive brain herniation through the craniotomy defect, transferral sign as well as trans tentorial and uncal herniation with very poor neurological exam.  He is unlikely to survive this and make any meaningful improvement.  I await arrival of his son to have discussion with him about his extremely poor condition and impending death and need to withdraw life support as it will be futile.  Discussed with Dr. Everardo All critical care medicine.This patient is critically ill and at significant risk of neurological worsening, death and care requires constant monitoring of vital signs, hemodynamics,respiratory and cardiac monitoring, extensive review of multiple databases, frequent neurological assessment, discussion with family, other specialists and medical decision making of high complexity.I have made any additions or clarifications directly to the above note.This critical care time does not reflect procedure time, or teaching time or supervisory time of PA/NP/Med Resident etc but could involve care discussion time.  I spent 35 minutes of neurocritical care time  in the care of  this patient.      Delia Heady, MD Medical Director [facility] Pager: [phone] 09/20/2022 1:43 PM   To contact Stroke Continuity provider, please refer to [email or web address]. After hours, contact General Neurology

## 2022-09-20 NOTE — Anesthesia Postprocedure Evaluation (Signed)
Anesthesia Post Note  Patient: Khrystian Schmick  Procedure(s) Performed: LEFT DECOMPRESSIVE CRANIECTOMY WITH PLACEMENT OF BONE FLAP IN ABDOMEN (Left: Head)     Patient location during evaluation: ICU Anesthesia Type: General Level of consciousness: patient remains intubated per anesthesia plan Pain management: pain level controlled Vital Signs Assessment: post-procedure vital signs reviewed and stable Respiratory status: patient remains intubated per anesthesia plan Cardiovascular status: blood pressure returned to baseline and stable Postop Assessment: no headache, no backache and no apparent nausea or vomiting Anesthetic complications: no  No notable events documented.  Last Vitals:  Vitals:   09/20/22 0900 09/20/22 1000  BP:    Pulse: (!) 41 (!) 37  Resp: (!) 23 (!) 23  Temp:    SpO2: 96% 96%    Last Pain:  Vitals:   09/20/22 0800  TempSrc: Axillary                 Henderson Frampton L Jaxton Casale

## 2022-09-20 NOTE — Progress Notes (Signed)
NAME:  Marcus Wall, MRN:  [id number], DOB:  1960-04-23, LOS: 1 ADMISSION DATE:  09/19/2022, CONSULTATION DATE:  9/10 REFERRING MD:  Derry Lory- Neuro, CHIEF COMPLAINT:  post- NIR   History of Present Illness:  [name] is a 62 year old gentleman with a history of hypertension who presented after being found hanging out of bed by his friend.  He was last known well around 8 PM.  Around 1:42 he was found with vomiting, diaphoresis, making grunting noises with left gaze preference.  He was only moving his left side at this point.  Imaging in the emergency department demonstrated hyperdense left MCA.  CTA demonstrated left ICA occlusion.  He was outside the TNK window.  He underwent neurointerventional thrombectomy of left ICA, left MCA, and left ACA achieving TICI 3 flow.  He had an acutely ulcerated plaque at the left proximal ICA with 60 to 65% stenosis.  No stents.  Imaging postintervention did not demonstrate any hemorrhage or mass effect.  Right femoral access site with Angio-Seal closure.  Pertinent  Medical History  Hypertension  Significant Hospital Events: Including procedures, antibiotic start and stop dates in addition to other pertinent events   9/10 admitted, neurointerventional thrombectomy of left ICA, MCA, ACA 9/10 urgent left decompressive hemicraniectomy for cerebral edema   Interim History / Subjective:  Overnight, was taken for an urgent left hemicraniectomy for cerebral edema. This morning starting at 0600, bradycardic to 40s. In the OR, found to have  Currently off propofol and fentanyl.   Objective   Blood pressure 118/75, pulse (!) 48, temperature (!) 97 F (36.1 C), temperature source Axillary, resp. rate (!) 22, height 6\' 3"  (1.905 m), weight (!) 146.8 kg, SpO2 96%.    Vent Mode: PRVC FiO2 (%):  [50 %-60 %] 50 % Set Rate:  [20 bmp] 20 bmp Vt Set:  [600 mL] 600 mL PEEP:  [5 cmH20] 5 cmH20 Plateau Pressure:  [14 cmH20-16 cmH20] 14 cmH20    Intake/Output Summary (Last 24 hours) at 09/20/2022 0814 Last data filed at 09/20/2022 0726 Gross per 24 hour  Intake 4566.38 ml  Output 4335 ml  Net 231.38 ml   Filed Weights   09/19/22 0200 09/19/22 0223 09/20/22 0500  Weight: (!) 148.8 kg (!) 148.8 kg (!) 146.8 kg   Examination: General: Critically ill-appearing man lying in bed intubated, not responsive to sternal rub, off sedation  HENT: eyes anicteric. Left pupil blown to approx 4 mm and non reactive, right pupil pinpoint but reactive  Lungs: Breathing comfortably on mechanical ventilation, CTAB Cardiovascular: S1-S2, regular rate and rhythm. Bradycardic  Abdomen: Soft, nontender, nondistended Extremities: No peripheral edema.   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  S/p left ICA, MCA, ACA revascularization  S/p urgent left decompressive hemicraniectomy  Cerebral edema with herniation  CT head: large amount of left hemisphere herniating through craniectomy defect, large amount of subarachnoid blood over left hemisphere, subfalcine and uncal herniation rightward midline shift of approximately 19 mm, widespread cytotoxic edema throughout left hemisphere  - Neurology and Neurosurgery following, appreciate recommendations  - Stroke management per primary team  - Increase to Hypertonic saline 3% @100  mL/hr with Na goal 150-155  - Q6 Na checks  - Atorvastatin 80 mg  - Monitor on tele  - BMP pending - PT, OT, SLP when able  Acute respiratory failure  Post-op ventilator management  Breathing comfortably on vent.  - Continue full vent support - Hold on SBT/extubation  - Chest PT   Right upper lobe atelectasis vs aspiration Abnormal chest x-ray with widened mediastinum. O2 sats within normal range.  - Aggressive chest PT  - Considered bronchoscopy but will defer, prioritize neuro management   Bradycardia   Secondary to brain herniation - BMP pending  - Monitor on tele  - Continue to monitor   Hypertension  SBP goal 120-160.  - Off Cleviprex - Continue PRN Hydralazine 10 mg q6h for SBP >160 or DBP >110   Type 2 Diabetes Mellitus  A1c 6.7  - SSI  - Check BG q4h   Nutrition  Continue tube feeding.  - Prosource 60 mL daily  - Pivot 1.5 40 mL/hr  - Consult to RD, recommendations appreciated   GOC  - Will continue to attempt to contact family. Grim prognosis in the setting of brain herniation, neuro exam, and worsening hemodynamics. Anticipate in hospital death.   Best Practice (right click and "Reselect all SmartList Selections" daily)   Diet/type: tubefeeds DVT prophylaxis: LMWH GI prophylaxis: N/A Lines: N/A Foley:  Yes, and it is still needed Code Status:  full code Last date of multidisciplinary goals of care discussion [family or friends not at bedside. There have been multiple unsuccessful attempts to contact the family since his procedure last night]  Labs   CBC: Recent Labs  Lab 09/19/22 0245 09/19/22 0251 09/19/22 0529  WBC  --  6.1 5.8  NEUTROABS  --  2.1 4.5  HGB 15.0 15.0 14.8  HCT 44.0 45.3 45.1  MCV  --  85.5 84.3  PLT  --  PLATELET CLUMPS NOTED ON SMEAR, UNABLE TO ESTIMATE 195    Basic Metabolic Panel: Recent Labs  Lab 09/19/22 0245 09/19/22 0529 09/19/22 1154 09/19/22 1849 09/20/22 0128 09/20/22 0558  NA 136 135 138 144 150* 149*  K 6.0* 4.2  --   --   --   --   CL 102 104  --   --   --   --   CO2  --  24  --   --   --   --   GLUCOSE 188* 143*  --   --   --   --   BUN 25* 14  --   --   --   --   CREATININE 1.20 1.01  --   --   --   --   CALCIUM  --  8.7*  --   --   --   --   MG  --   --  1.9 2.0  --  2.1  PHOS  --   --  2.9 4.2  --  3.7   GFR: Estimated Creatinine Clearance: 118.8 mL/min (by C-G formula based on SCr of 1.01 mg/dL). Recent Labs  Lab 09/19/22 0245 09/19/22 0251 09/19/22 0529  WBC  --  6.1 5.8  LATICACIDVEN 2.4*  --    --     Liver Function Tests: Recent Labs  Lab 09/19/22 0529  AST 26  ALT 36  ALKPHOS 68  BILITOT 0.3  PROT 7.3  ALBUMIN 3.7   No results for input(s): "LIPASE", "AMYLASE" in the last 168 hours. No results for input(s): "AMMONIA" in the last 168 hours.  ABG    Component Value Date/Time   TCO2 26 09/19/2022 0245     Coagulation Profile: Recent Labs  Lab 09/19/22 0529  INR 1.0    Cardiac  Enzymes: No results for input(s): "CKTOTAL", "CKMB", "CKMBINDEX", "TROPONINI" in the last 168 hours.  HbA1C: Hgb A1c MFr Bld  Date/Time Value Ref Range Status  09/19/2022 05:29 AM 6.7 (H) 4.8 - 5.6 % Final    Comment:    (NOTE) Pre diabetes:          5.7%-6.4%  Diabetes:              >6.4%  Glycemic control for   <7.0% adults with diabetes   03/10/2010 03:00 AM (H) <5.7 % Final   6.1 (NOTE)                                                                       According to the ADA Clinical Practice Recommendations for 2011, when HbA1c is used as a screening test:   >=6.5%   Diagnostic of Diabetes Mellitus           (if abnormal result  is confirmed)  5.7-6.4%   Increased risk of developing Diabetes Mellitus  References:Diagnosis and Classification of Diabetes Mellitus,Diabetes Care,2011,34(Suppl 1):S62-S69 and Standards of Medical Care in         Diabetes - 2011,Diabetes Care,2011,34  (Suppl 1):S11-S61.    CBG: Recent Labs  Lab 09/19/22 1155 09/19/22 1916 09/19/22 2315 09/20/22 0440 09/20/22 0718  GLUCAP 172* 148* 161* 207* 197*    Review of Systems:   Unable to be obtained due to mental status  Past Medical History:  He,  has a past medical history of Hypertension.   Surgical History:   Past Surgical History:  Procedure Laterality Date   BACK SURGERY     CRANIOTOMY Left 09/19/2022   Procedure: LEFT DECOMPRESSIVE CRANIECTOMY WITH PLACEMENT OF BONE FLAP IN ABDOMEN;  Surgeon: Bedelia Person, MD;  Location: [facility] OR;  Service: Neurosurgery;  Laterality: Left;    KNEE ARTHROCENTESIS     RADIOLOGY WITH ANESTHESIA N/A 09/19/2022   Procedure: RADIOLOGY WITH ANESTHESIA;  Surgeon: Radiologist, Medication, MD;  Location: [facility] OR;  Service: Radiology;  Laterality: N/A;     Social History:   reports that he has been smoking cigars. He has never used smokeless tobacco. He reports that he does not drink alcohol and does not use drugs.   Family History:  His family history includes Alzheimer's disease in his mother; Diabetes in his father; Hypertension in his mother.   Allergies No Known Allergies   Home Medications  Prior to Admission medications   Medication Sig Start Date End Date Taking? Authorizing Provider  methocarbamol (ROBAXIN) 500 MG tablet Take 1 tablet (500 mg total) by mouth 2 (two) times daily. 11/30/17   Aviva Kluver B, PA-C  metroNIDAZOLE (FLAGYL) 500 MG tablet Take 1 tablet (500 mg total) by mouth 2 (two) times daily. Patient not taking: Reported on 11/30/2017 04/26/16   Derwood Kaplan, MD  omeprazole (PRILOSEC) 20 MG capsule Take 1 capsule (20 mg total) by mouth daily. Patient not taking: Reported on 11/30/2017 04/26/16   Derwood Kaplan, MD  ondansetron (ZOFRAN ODT) 4 MG disintegrating tablet 4mg  ODT q4 hours prn nausea/vomit Patient not taking: Reported on 11/30/2017 06/03/15   Loren Racer, MD    Tempie Hoist, MS4

## 2022-09-20 NOTE — Progress Notes (Signed)
OT Cancellation Note  Patient Details Name: Anias Rummer MRN: [id number] DOB: 20-May-1960   Cancelled Treatment:    Reason Eval/Treat Not Completed: Patient not medically ready. Pt remains sedated and intubated. Will continue to follow for medical readiness for therapy eval - when deemed appropriate by medical team as schedule allows  Emelda Fear 09/20/2022, 9:17 AM  Nyoka Cowden OTR/L Acute Rehabilitation Services Office: [phone]

## 2022-09-21 LAB — GLUCOSE, CAPILLARY
Glucose-Capillary: 172 mg/dL — ABNORMAL HIGH (ref 70–99)
Glucose-Capillary: 136 mg/dL — ABNORMAL HIGH (ref 70–99)
Glucose-Capillary: 130 mg/dL — ABNORMAL HIGH (ref 70–99)
Glucose-Capillary: 161 mg/dL — ABNORMAL HIGH (ref 70–99)
Glucose-Capillary: 160 mg/dL — ABNORMAL HIGH (ref 70–99)
Glucose-Capillary: 150 mg/dL — ABNORMAL HIGH (ref 70–99)

## 2022-09-21 LAB — SODIUM
Sodium: 163 mmol/L (ref 135–145)
Sodium: 170 mmol/L (ref 135–145)
Sodium: 177 mmol/L (ref 135–145)

## 2022-09-21 LAB — OSMOLALITY, URINE: Osmolality, Ur: 216 mosm/kg — ABNORMAL LOW (ref 300–900)

## 2022-09-21 MED ORDER — FREE WATER
250.0000 mL | Status: DC
  Administered 2022-09-21 – 2022-09-22 (×2): 250 mL

## 2022-09-21 MED ORDER — FENTANYL CITRATE PF 50 MCG/ML IJ SOSY
50.0000 ug | PREFILLED_SYRINGE | Freq: Once | INTRAMUSCULAR | Status: AC
  Administered 2022-09-21: 50 ug via INTRAVENOUS
  Filled 2022-09-21: qty 1

## 2022-09-21 MED ORDER — DESMOPRESSIN ACETATE 4 MCG/ML IJ SOLN
4.0000 ug | Freq: Once | INTRAMUSCULAR | Status: AC
  Administered 2022-09-21: 4 ug via INTRAVENOUS
  Filled 2022-09-21: qty 1

## 2022-09-21 MED ORDER — SODIUM CHLORIDE 0.45 % NICU IV INFUSION SIMPLE
INJECTION | INTRAVENOUS | Status: DC
  Filled 2022-09-21 (×3): qty 500

## 2022-09-21 MED ORDER — SODIUM CHLORIDE 0.45 % IV SOLN: INTRAVENOUS | Status: DC

## 2022-09-21 NOTE — Plan of Care (Signed)
  Problem: Education: Goal: Knowledge of disease or condition will improve Outcome: Not Progressing Goal: Knowledge of secondary prevention will improve (MUST DOCUMENT ALL) Outcome: Not Progressing Goal: Knowledge of patient specific risk factors will improve Loraine Leriche N/A or DELETE if not current risk factor) Outcome: Not Progressing   Problem: Ischemic Stroke/TIA Tissue Perfusion: Goal: Complications of ischemic stroke/TIA will be minimized Outcome: Not Progressing   Problem: Coping: Goal: Will verbalize positive feelings about self Outcome: Not Progressing Goal: Will identify appropriate support needs Outcome: Not Progressing   Problem: Health Behavior/Discharge Planning: Goal: Ability to manage health-related needs will improve Outcome: Not Progressing Goal: Goals will be collaboratively established with patient/family Outcome: Not Progressing   Problem: Self-Care: Goal: Ability to participate in self-care as condition permits will improve Outcome: Not Progressing Goal: Verbalization of feelings and concerns over difficulty with self-care will improve Outcome: Not Progressing Goal: Ability to communicate needs accurately will improve Outcome: Not Progressing   Problem: Nutrition: Goal: Risk of aspiration will decrease Outcome: Not Progressing Goal: Dietary intake will improve Outcome: Not Progressing   Problem: Education: Goal: Understanding of CV disease, CV risk reduction, and recovery process will improve Outcome: Not Progressing Goal: Individualized Educational Video(s) Outcome: Not Progressing   Problem: Activity: Goal: Ability to return to baseline activity level will improve Outcome: Not Progressing   Problem: Cardiovascular: Goal: Ability to achieve and maintain adequate cardiovascular perfusion will improve Outcome: Not Progressing Goal: Vascular access site(s) Level 0-1 will be maintained Outcome: Not Progressing   Problem: Health Behavior/Discharge  Planning: Goal: Ability to safely manage health-related needs after discharge will improve Outcome: Not Progressing   Problem: Activity: Goal: Ability to tolerate increased activity will improve Outcome: Not Progressing   Problem: Respiratory: Goal: Ability to maintain a clear airway and adequate ventilation will improve Outcome: Not Progressing   Problem: Role Relationship: Goal: Method of communication will improve Outcome: Not Progressing   Problem: Safety: Goal: Non-violent Restraint(s) Outcome: Not Progressing   Problem: Education: Goal: Knowledge of the prescribed therapeutic regimen will improve Outcome: Not Progressing   Problem: Clinical Measurements: Goal: Usual level of consciousness will be regained or maintained. Outcome: Not Progressing Goal: Neurologic status will improve Outcome: Not Progressing Goal: Ability to maintain intracranial pressure will improve Outcome: Not Progressing   Problem: Skin Integrity: Goal: Demonstration of wound healing without infection will improve Outcome: Not Progressing

## 2022-09-21 NOTE — Progress Notes (Signed)
NAME:  Marcus Wall, MRN:  [id number], DOB:  [birth year], LOS: 2 ADMISSION DATE:  09/19/2022, CONSULTATION DATE:  9/10 REFERRING MD:  Derry Lory- Neuro, CHIEF COMPLAINT:  post- NIR   History of Present Illness:  [name] is a 62 year old gentleman with a history of hypertension who presented after being found hanging out of bed by his friend.  He was last known well around 8 PM.  Around 1:42 he was found with vomiting, diaphoresis, making grunting noises with left gaze preference.  He was only moving his left side at this point.  Imaging in the emergency department demonstrated hyperdense left MCA.  CTA demonstrated left ICA occlusion.  He was outside the TNK window.  He underwent neurointerventional thrombectomy of left ICA, left MCA, and left ACA achieving TICI 3 flow.  He had an acutely ulcerated plaque at the left proximal ICA with 60 to 65% stenosis.  No stents.  Imaging postintervention did not demonstrate any hemorrhage or mass effect.  Right femoral access site with Angio-Seal closure.  Pertinent  Medical History  Hypertension  Significant Hospital Events: Including procedures, antibiotic start and stop dates in addition to other pertinent events   9/10 admitted, neurointerventional thrombectomy of left ICA, MCA, ACA 9/10 urgent left decompressive hemicraniectomy for cerebral edema   Interim History / Subjective:  Critically ill on MV  Objective   Blood pressure (!) 146/77, pulse (!) 58, temperature 99.2 F (37.3 C), temperature source Axillary, resp. rate (!) 28, height 6\' 3"  (1.905 m), weight (!) 147.3 kg, SpO2 99%.    Vent Mode: CPAP;PSV FiO2 (%):  [40 %] 40 % Set Rate:  [20 bmp] 20 bmp Vt Set:  [600 mL] 600 mL PEEP:  [5 cmH20] 5 cmH20 Pressure Support:  [10 cmH20] 10 cmH20   Intake/Output Summary (Last 24 hours) at 09/21/2022 0818 Last data filed at 09/21/2022 0800 Gross per 24 hour  Intake 2813.98 ml  Output 3565 ml  Net -751.02 ml   Filed Weights    09/19/22 0223 09/20/22 0500 09/21/22 0500  Weight: (!) 148.8 kg (!) 146.8 kg (!) 147.3 kg   Physical Exam: General: Critically ill-appearing, unresponsive HENT: Marcus Wall, AT, ETT in place Eyes: EOMI, no scleral icterus Respiratory: Clear to auscultation bilaterally.  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  S/p left ICA, MCA, ACA revascularization  S/p urgent left decompressive hemicraniectomy  Cerebral edema with herniation  CT head: large amount of left hemisphere herniating through craniectomy defect, large amount of subarachnoid blood over left hemisphere, subfalcine and uncal herniation rightward midline shift of approximately 19 mm, widespread cytotoxic edema throughout left hemisphere  - Neurology and Neurosurgery following, appreciate recommendations  - Stroke management per primary team  - Hypertonic held 3% with pending labs, Na goal 150-155  - Q6 Na checks  - Atorvastatin 80 mg  - Monitor on tele   Acute respiratory failure  Post-op ventilator management  Breathing comfortably on vent.  -Full vent support -LTVV, 4-8cc/kg IBW with goal Pplat<30 and DP<15 - Hold on SBT/extubation due to critical care status/mental status - Chest PT  Right upper lobe atelectasis vs aspiration - improving Abnormal chest x-ray with widened mediastinum. O2 sats within normal range.  - Aggressive chest PT  - Considered bronchoscopy  but will defer, prioritize neuro management   Bradycardia  Secondary to brain herniation - BMP pending  - Monitor on tele  - Continue to monitor   Hypertension  SBP goal 120-160.  - Off Cleviprex - Continue PRN Hydralazine 10 mg q6h for SBP >160 or DBP >110   Type 2 Diabetes Mellitus  A1c 6.7   - SSI  - Check BG q4h   Nutrition  Continue tube feeding.  - Prosource 60 mL daily  - Pivot 1.5 40 mL/hr  - Consult to RD, recommendations appreciated   GOC  - Will continue to attempt to contact family. Grim prognosis in the setting of brain herniation, neuro exam, and worsening hemodynamics. Anticipate in hospital death.  - Plan on family meeting this morning with primary team  Best Practice (right click and "Reselect all SmartList Selections" daily)   Diet/type: tubefeeds DVT prophylaxis: LMWH GI prophylaxis: N/A Lines: N/A Foley:  Yes, and it is still needed Code Status:  full code Last date of multidisciplinary goals of care discussion [family or friends not at bedside. There have been multiple unsuccessful attempts to contact the family since his procedure last night]  The patient is critically ill with multiple organ systems failure and requires high complexity decision making for assessment and support, frequent evaluation and titration of therapies, application of advanced monitoring technologies and extensive interpretation of multiple databases.  Independent Critical Care Time: 60 Minutes.   Marcus Wall, M.D. [facility] Pulmonary/Critical Care Medicine 09/21/2022 8:37 AM   Please see Amion for pager number to reach on-call Pulmonary and Critical Care Team.

## 2022-09-21 NOTE — Progress Notes (Signed)
Subjective: NAEs  Objective: Vital signs in last 24 hours: Temp:  [96.5 F (35.8 C)-100.4 F (38 C)] 100.4 F (38 C) (09/12 1200) Pulse Rate:  [37-95] 95 (09/12 1330) Resp:  [23-31] 30 (09/12 1330) BP: (136-152)/(72-86) 140/84 (09/12 1330) SpO2:  [96 %-100 %] 98 % (09/12 1330) Arterial Line BP: (82-192)/(63-99) 151/81 (09/12 1330) FiO2 (%):  [40 %] 40 % (09/12 0755) Weight:  [147.3 kg] 147.3 kg (09/12 0500)  Intake/Output from previous day: 09/11 0701 - 09/12 0700 In: 2814 [I.V.:1612; NG/GT:1002; IV Piggyback:200] Out: 2590 [Urine:2525; Drains:65] Intake/Output this shift: Total I/O In: 100 [NG/GT:100] Out: 3950 [Urine:3950]  Pupils 5 mm, NR bilaterally Positive corneals bilaterally, + gag  Lab Results: Recent Labs    09/19/22 0529 09/20/22 0833  WBC 5.8 14.7*  HGB 14.8 12.2*  HCT 45.1 39.4  PLT 195 166   BMET Recent Labs    09/19/22 0529 09/19/22 1154 09/20/22 0833 09/20/22 1219 09/20/22 1714 09/21/22 0741  NA 135   < > 150*   < > 155* 163*  K 4.2  --  4.1  --   --   --   CL 104  --  122*  --   --   --   CO2 24  --  20*  --   --   --   GLUCOSE 143*  --  180*  --   --   --   BUN 14  --  16  --   --   --   CREATININE 1.01  --  1.09  --   --   --   CALCIUM 8.7*  --  8.6*  --   --   --    < > = values in this interval not displayed.    Studies/Results: ECHOCARDIOGRAM COMPLETE  Result Date: 09/20/2022    ECHOCARDIOGRAM REPORT   Patient Name:   TYGE NEARHOOF [facility] Date of Exam: 09/20/2022 Medical Rec #:  [id number]              Height:       75.0 in Accession #:    [id number]             Weight:       323.6 lb Date of Birth:  13-Jul-1960              BSA:          2.692 m Patient Age:    62 years               BP:           118/75 mmHg Patient Gender: M                      HR:           38 bpm. Exam Location:  Inpatient Procedure: 2D Echo, Cardiac Doppler and Color Doppler Indications:    Stroke  History:        Patient has no prior history of Echocardiogram  examinations.                 Stroke. Large left ICA infarct.  Sonographer:    Sheralyn Boatman RDCS Referring Phys: [id number] [facility]  Sonographer Comments: Echo performed with patient supine and on artificial respirator. IMPRESSIONS  1. Left ventricular ejection fraction, by estimation, is 70 to 75%. The left ventricle has hyperdynamic function. The left ventricle has no regional wall motion abnormalities. There is mild left ventricular  hypertrophy. Left ventricular diastolic parameters were normal.  2. Right ventricular systolic function is normal. The right ventricular size is moderately enlarged. Tricuspid regurgitation signal is inadequate for assessing PA pressure.  3. The mitral valve is normal in structure. No evidence of mitral valve regurgitation. No evidence of mitral stenosis.  4. The aortic valve was not well visualized. Aortic valve regurgitation is not visualized. No aortic stenosis is present.  5. The inferior vena cava is normal in size with greater than 50% respiratory variability, suggesting right atrial pressure of 3 mmHg. Comparison(s): No prior Echocardiogram. Conclusion(s)/Recommendation(s): No intracardiac source of embolism detected on this transthoracic study. Consider a transesophageal echocardiogram to exclude cardiac source of embolism if clinically indicated. FINDINGS  Left Ventricle: Left ventricular ejection fraction, by estimation, is 70 to 75%. The left ventricle has hyperdynamic function. The left ventricle has no regional wall motion abnormalities. The left ventricular internal cavity size was normal in size. There is mild left ventricular hypertrophy. Left ventricular diastolic parameters were normal. Right Ventricle: The right ventricular size is moderately enlarged. No increase in right ventricular wall thickness. Right ventricular systolic function is normal. Tricuspid regurgitation signal is inadequate for assessing PA pressure. Left Atrium: Left atrial size was normal in  size. Right Atrium: Right atrial size was normal in size. Pericardium: There is no evidence of pericardial effusion. Mitral Valve: The mitral valve is normal in structure. No evidence of mitral valve regurgitation. No evidence of mitral valve stenosis. Tricuspid Valve: The tricuspid valve is normal in structure. Tricuspid valve regurgitation is not demonstrated. No evidence of tricuspid stenosis. Aortic Valve: The aortic valve was not well visualized. Aortic valve regurgitation is not visualized. No aortic stenosis is present. Pulmonic Valve: The pulmonic valve was normal in structure. Pulmonic valve regurgitation is not visualized. No evidence of pulmonic stenosis. Aorta: The aortic root and ascending aorta are structurally normal, with no evidence of dilitation. Venous: The inferior vena cava is normal in size with greater than 50% respiratory variability, suggesting right atrial pressure of 3 mmHg. IAS/Shunts: No atrial level shunt detected by color flow Doppler.  LEFT VENTRICLE PLAX 2D LVIDd:         4.50 cm      Diastology LVIDs:         2.20 cm      LV e' medial:    7.83 cm/s LV PW:         1.40 cm      LV E/e' medial:  11.6 LV IVS:        1.30 cm      LV e' lateral:   11.90 cm/s LVOT diam:     2.50 cm      LV E/e' lateral: 7.6 LV SV:         167 LV SV Index:   62 LVOT Area:     4.91 cm  LV Volumes (MOD) LV vol d, MOD A2C: 140.0 ml LV vol d, MOD A4C: 141.0 ml LV vol s, MOD A2C: 33.9 ml LV vol s, MOD A4C: 41.8 ml LV SV MOD A2C:     106.1 ml LV SV MOD A4C:     141.0 ml LV SV MOD BP:      110.9 ml RIGHT VENTRICLE             IVC RV S prime:     17.20 cm/s  IVC diam: 2.00 cm TAPSE (M-mode): 2.8 cm LEFT ATRIUM  Index        RIGHT ATRIUM           Index LA diam:        2.70 cm 1.00 cm/m   RA Area:     16.50 cm LA Vol (A2C):   67.4 ml 25.03 ml/m  RA Volume:   40.20 ml  14.93 ml/m LA Vol (A4C):   59.8 ml 22.21 ml/m LA Biplane Vol: 62.7 ml 23.29 ml/m  AORTIC VALVE LVOT Vmax:   149.00 cm/s LVOT Vmean:   85.900 cm/s LVOT VTI:    0.340 m  AORTA Ao Root diam: 4.00 cm Ao Asc diam:  3.70 cm MITRAL VALVE MV Area (PHT): 2.37 cm    SHUNTS MV Decel Time: 320 msec    Systemic VTI:  0.34 m MV E velocity: 90.90 cm/s  Systemic Diam: 2.50 cm MV A velocity: 85.90 cm/s MV E/A ratio:  1.06 Photographer signed by Carolan Clines Signature Date/Time: 09/20/2022/10:55:53 AM    Final    CT HEAD WO CONTRAST ( )  Result Date: 09/20/2022 CLINICAL DATA:  Intracranial hemorrhage EXAM: CT HEAD WITHOUT CONTRAST TECHNIQUE: Contiguous axial images were obtained from the base of the skull through the vertex without intravenous contrast. RADIATION DOSE REDUCTION: This exam was performed according to the departmental dose-optimization program which includes automated exposure control, adjustment of the mA and/or kV according to patient size and/or use of iterative reconstruction technique. COMPARISON:  09/19/2022 FINDINGS: Brain: Now status post decompressive left hemi craniectomy with large amount of the left hemisphere herniating through the craniectomy defect. There is a large amount of subarachnoid blood over the left hemisphere. There is an intraparenchymal component measuring approximately 3.5 x 2.5 cm. There is subfalcine herniation of the left cingulate gyrus and rightward midline shift of approximately 19 mm. There is downward transtentorial herniation of the left uncus. Entrapment of the posterior right lateral ventricle with mild hydrocephalus. The left lateral ventricle is effaced. Widespread cytotoxic edema throughout the left hemisphere with relative sparing of the PCA territory. Vascular: Limited visualization due to the degree of edema. Skull: Left decompressive hemi craniectomy with large amount of overlying soft tissue swelling and left parietal scalp hematoma. Sinuses/Orbits: Paranasal sinuses are clear.  Normal orbits. Other: None IMPRESSION: 1. Now status post decompressive left hemicraniectomy with large  amount of the left hemisphere herniating through the craniectomy defect. 2. Large amount of subarachnoid blood over the left hemisphere with an intraparenchymal component measuring approximately 3.5 x 2.5 cm. 3. Subfalcine and uncal herniation rightward midline shift of approximately 19 mm. 4. Entrapment of the posterior right lateral ventricle with mild hydrocephalus. 5. Widespread cytotoxic edema throughout the left hemisphere with relative sparing of the PCA territory. These results were communicated to Dr. Erick Blinks at 3:24 am on 09/20/2022 by text page via the [facility] messaging system. Electronically Signed   By: Deatra Robinson M.D.   On: 09/20/2022 03:25   DG Abd 1 View  Result Date: 09/19/2022 CLINICAL DATA:  Orogastric tube placement. EXAM: ABDOMEN - 1 VIEW COMPARISON:  None Available. FINDINGS: Distal tip of nasogastric tube is seen in expected position of proximal stomach. IMPRESSION: Distal tip of nasogastric tube is seen in expected position of proximal stomach. Electronically Signed   By: Lupita Raider M.D.   On: 09/19/2022 15:31   DG CHEST PORT 1 VIEW  Result Date: 09/19/2022 CLINICAL DATA:  Central line placement. EXAM: PORTABLE CHEST 1 VIEW COMPARISON:  Same day. FINDINGS: Normal cardiac size. Endotracheal  and nasogastric tubes are in grossly good position. Interval placement of right internal jugular catheter with distal tip in expected position of SVC. Right upper lobe airspace opacity is noted concerning for pneumonia or atelectasis. Left lung is clear. IMPRESSION: Interval placement of right internal jugular catheter with distal tip in expected position of SVC. Continued right upper lobe airspace opacity consistent with pneumonia or atelectasis. Electronically Signed   By: Lupita Raider M.D.   On: 09/19/2022 15:30   VAS US CAROTID  Result Date: 09/19/2022 Carotid Arterial Duplex Study Patient Name:  ZAIDE SUARES [facility]  Date of Exam:   09/19/2022 Medical Rec #: [id number]                Accession #:    [id number] Date of Birth: 1960/08/29               Patient Gender: M Patient Age:   14 years Exam Location:  [facility] Procedure:      VAS US CAROTID Referring Phys: Terrilee Files [facility] --------------------------------------------------------------------------------  Indications:       CVA. Risk Factors:      Hypertension. Limitations        Today's exam was limited due to a central line. Right side w/                    bandaging. Comparison Study:  No prior studies. Performing Technologist: Jean Rosenthal RDMS, RVT  Examination Guidelines: A complete evaluation includes B-mode imaging, spectral Doppler, color Doppler, and power Doppler as needed of all accessible portions of each vessel. Bilateral testing is considered an integral part of a complete examination. Limited examinations for reoccurring indications may be performed as noted.  Right Carotid Findings: +----------+--------+--------+--------+------------------+---------------------+           PSV cm/sEDV cm/sStenosisPlaque DescriptionComments              +----------+--------+--------+--------+------------------+---------------------+ CCA Prox                                            Not visualized- see                                                       limitations           +----------+--------+--------+--------+------------------+---------------------+ CCA Distal56      10                                intimal thickening    +----------+--------+--------+--------+------------------+---------------------+ ICA Prox  33      9                                                       +----------+--------+--------+--------+------------------+---------------------+ ICA Distal38      10                                                      +----------+--------+--------+--------+------------------+---------------------+  ECA       51      11                                                       +----------+--------+--------+--------+------------------+---------------------+ +----------+--------+-------+----------------+-------------------+           PSV cm/sEDV cmsDescribe        Arm Pressure (mmHG) +----------+--------+-------+----------------+-------------------+ [id number]            Multiphasic, WNL                    +----------+--------+-------+----------------+-------------------+ +---------+--------+--+--------+-+---------+ VertebralPSV cm/s25EDV cm/s5Antegrade +---------+--------+--+--------+-+---------+  Left Carotid Findings: +----------+--------+--------+--------+------------------+------------------+           PSV cm/sEDV cm/sStenosisPlaque DescriptionComments           +----------+--------+--------+--------+------------------+------------------+ CCA Prox  152     24                                                   +----------+--------+--------+--------+------------------+------------------+ CCA Distal43      12                                intimal thickening +----------+--------+--------+--------+------------------+------------------+ ICA Prox  60      20      1-39%   heterogenous mild                    +----------+--------+--------+--------+------------------+------------------+ ICA Distal69      29                                                   +----------+--------+--------+--------+------------------+------------------+ ECA       47      9                                                    +----------+--------+--------+--------+------------------+------------------+ +----------+--------+--------+----------------+-------------------+           PSV cm/sEDV cm/sDescribe        Arm Pressure (mmHG) +----------+--------+--------+----------------+-------------------+ [id number]             Multiphasic, WNL                    +----------+--------+--------+----------------+-------------------+  +---------+--------+--+--------+--+---------+ VertebralPSV cm/s53EDV cm/s12Antegrade +---------+--------+--+--------+--+---------+   Summary: Right Carotid: The extracranial vessels were near-normal with only minimal wall                thickening or plaque. Left Carotid: Velocities in the left ICA are consistent with a 1-39% stenosis. Vertebrals:  Bilateral vertebral arteries demonstrate antegrade flow. Subclavians: Normal flow hemodynamics were seen in bilateral subclavian              arteries. *See table(s) above for measurements and observations.     Preliminary     Assessment/Plan: 62 yo M with massive L ICA infarct s/p left decompressive  craniectomy - Prognosis for meaningful neurologic recovery is nil - recommend palliative care - will d/c drain and s/o tomorrow   Bedelia Person 09/21/2022, 1:55 PM

## 2022-09-21 NOTE — Plan of Care (Signed)
PCCM Progress Note  I updated family who is currently in discussion regarding goals of care. Has not made a decision to withdrawal of care due to multiple family members involved. At this time, patient is to maintain DNR status but request from son is to continue medical treatment until everyone can come together regarding withdrawal. Son is aware that additional treatment is unlikely to change the outcome of patient's known brain herniation.  Patient with increased UOP 5L and increased sodium. DDAVP given and trending labs. Started 1/2 NS @ 100cc/h.

## 2022-09-21 NOTE — Progress Notes (Signed)
OT Cancellation Note and Discharge  Patient Details Name: Marcus Wall MRN: [id number] DOB: 10/17/1960   Cancelled Treatment:    Reason Eval/Treat Not Completed: Patient not medically ready. Per chart, prognosis is poor and MD is trying to discuss comfort care with family. Will plan to sign off for OT at this time. If there is a change, please re-consult OT when medically appropriate.   Lindon Romp OT Acute Rehabilitation Services Office [phone]   Evette Georges 09/21/2022, 7:39 AM

## 2022-09-21 NOTE — Progress Notes (Signed)
PT Cancellation Note  Patient Details Name: Fotios Witter MRN: [id number] DOB: [birth year]   Cancelled Treatment:    Reason Eval/Treat Not Completed: (P) Patient not medically ready. Per chart, prognosis is poor and MD is trying to discuss comfort care with family. Will plan to sign off for PT at this time. If there is a change, please re-consult PT when medically appropriate.   Virgil Benedict, PT, DPT Acute Rehabilitation Services  Office: [phone]    Bettina Gavia 09/21/2022, 7:36 AM

## 2022-09-21 NOTE — Progress Notes (Signed)
SLP Cancellation Note  Patient Details Name: Braxtynn Curiale MRN: [id number] DOB: 04/25/1960   Cancelled treatment:       Reason Eval/Treat Not Completed: Patient not medically ready. Per chart, prognosis is poor and MD is trying to discuss comfort care with family. Will plan to sign off for OT at this time. If there is a change, please re-consult SLP when medically appropriate.     Mahala Menghini., M.A. CCC-SLP Acute Rehabilitation Services Office [phone]  Secure chat preferred  09/21/2022, 8:00 AM

## 2022-09-21 NOTE — Progress Notes (Signed)
I spoke with phlebotomy @ 0230 to let them know that the a-line was not pulling back anymore and they needed to come draw the sodium that was ordered for 0200. At 0600 the sodium has still not been drawn and cannot get through on the phone to phlebotomy (sounds as if the phone is off the hook). CCM aware.

## 2022-09-21 NOTE — Progress Notes (Addendum)
STROKE TEAM PROGRESS NOTE   BRIEF HPI [name] is a 62 y.o. male with history of hypertension who presented as a code stroke due to right-sided weakness, left gaze preference, vomiting, and right hemianopsia.  Patient's last known well was 8 PM 9/9.  He was then found around 1:42 AM.  Friend states he saw him hanging off the bed, making noises, vomiting, moving only his left side.  Per EMS entrance port patient's heart rate would drop to 30s and required intermittent pacing.  He was also hypertensive up to the 200s systolic. CT head with hyperdense left MCA with developing left MCA stroke.  CTA with occlusion of the left ICA at bifurcation, occluded all the way to left MCA.  SIGNIFICANT HOSPITAL EVENTS 9/9: Taken for thrombectomy of L ACA A1, MCA Mi with TICI 3 revascularization  INTERIM HISTORY/SUBJECTIVE Patient remains sedated and intubated.  Unresponsive, on no sedation. 3% saline is off, last NA 163 No family at the bedside.  Neurological exam continues to be poor, Pupils are unequal and non reactive, Left pupil 7-108mm and right pupil 4-83mm, bilateral corneal reflex weak bilaterally no cough or gag, breathing over the ventilator, no response to pain.  Plan to have family meeting with family when they arrive in regards to prognosis and GOC  OBJECTIVE  CBC    Component Value Date/Time   WBC 14.7 (H) 09/20/2022 0833   RBC 4.45 09/20/2022 0833   HGB 12.2 (L) 09/20/2022 0833   HCT 39.4 09/20/2022 0833   PLT 166 09/20/2022 0833   MCV 88.5 09/20/2022 0833   MCH 27.4 09/20/2022 0833   MCHC 31.0 09/20/2022 0833   RDW 14.4 09/20/2022 0833   LYMPHSABS 1.0 09/19/2022 0529   MONOABS 0.3 09/19/2022 0529   EOSABS 0.0 09/19/2022 0529   BASOSABS 0.0 09/19/2022 0529    BMET    Component Value Date/Time   NA 163 (HH) 09/21/2022 0741   K 4.1 09/20/2022 0833   CL 122 (H) 09/20/2022 0833   CO2 20 (L) 09/20/2022 0833   GLUCOSE 180 (H) 09/20/2022 0833   BUN 16 09/20/2022 0833    CREATININE 1.09 09/20/2022 0833   CALCIUM 8.6 (L) 09/20/2022 0833   GFRNONAA >60 09/20/2022 0833    IMAGING past 24 hours No results found.  Vitals:   09/21/22 1200 09/21/22 1230 09/21/22 1300 09/21/22 1330  BP: 137/84 (!) 144/86 (!) 145/84 (!) 140/84  Pulse: 75 89 93 95  Resp: (!) 30 (!) 30 (!) 27 (!) 30  Temp: (!) 100.4 F (38 C)     TempSrc: Axillary     SpO2: 99% 98% 98% 98%  Weight:      Height:        PHYSICAL EXAM General: Critically ill middle-aged African-American male intubated and sedated CV: Regular rate and rhythm on monitor Respiratory: Mechanically ventilated GI: Abdomen soft and nontender Head covered with craniotomy bandage  NEURO:  Patient intubated on no sedation.  Eyes are closed.  Unresponsive.  Aphasic and not following commands.  Pupils left 7-8 mm and  right 4-5 mm both nonreactive.  Bilateral corneal reflex weakly intact, no cough or gag, breathing over the vent.  Does not open eyes to voice or pain. 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  S/p mechanical thrombectomy with TICI 3 revaularization with subsequent hemorrhagic transformation s/p decompressive craniotomy  Code Stroke CT head  No intracranial hemorrhage  Hyperdense left MCA M1 segment Aspects is 8 CTA head & neck w/perfusion Complete occlusion of the left ICA Complete occlusion of the left MCA with essentially no collateral blood flow CT perfusion  236 mm core infarct involving nearly the entire left ACA and MCA territories 86 mL calculated penumbra Post IR CT: No hemorrhage MRI   Acute infarction affecting the left frontal cortical, superior left temporal and left parietal lobe. Infarction of the left basal ganglia Mild swelling with left-to-right shift of 2 mm Minimal petechial blood products in distal left ACA territory  Repeat CT head 09/20/2022 large amount of the left hemisphere herniating  through the craniectomy defect. Large amount of subarachnoid blood over the left hemisphere with an intraparenchymal component measuring approximately 3.5 x 2.5 cm. Subfalcine and uncal herniation rightward midline shift of approximately 19 mm. Entrapment of the posterior right lateral ventricle with mild hydrocephalus. Widespread cytotoxic edema throughout the left hemisphere with relative sparing of the PCA territory. 2D Echo 70-75%. left ventricular hypertrophy  Carotid US negative   LDL 109 HgbA1c 6.7 VTE prophylaxis - SCDs No antithrombotic prior to admission, now on No antithrombotic pending MRI, then held due to surgery Therapy recommendations:  pending Disposition:  pending  Cerebral Edema Brain compression S/p left decompressive hemicraniotomy  250 mL bolus of 3% given 3% saline off. Last NA 163 Sodium goal 150-155 Q6 NA checks Neurosurgery consult Taken for urgent left hemicraniectomy 9/10  Acute respiratory failure  Post procedure Vent management CCM management, appreciate assistance No SBT indication today Continue full vent support VAP bundle  Hypertensive Emergency Home meds:  none Hypertensive into the 200s with EMS and on arrival  Cleviprex currently off  IVP PRNs added to assist with BP control Unstable Blood Pressure Goal: SBP between 130-150 for 24 hours and then less than 160   Hyperlipidemia Home meds:  none LDL 109, goal < 70 Add Lipitor 80mg   Continue statin at discharge  Diabetes type II, prediabetic Home meds: None HgbA1c 6.7, goal < 7.0 CBGs SSI Recommend close follow-up with PCP  Tobacco Abuse Patient smokes cigars  Dysphagia Patient has post-stroke dysphagia, SLP consulted    Diet   Diet NPO time specified   Advance diet as tolerated  Other Stroke Risk Factors Obesity, Body mass index is 40.59 kg/m., BMI >/= 30 associated with increased stroke risk, recommend weight loss, diet and exercise as appropriate   Other Active  Problems Hypocalcemia Replete, continue to monitor Leukocytosis 14.7, afebrile monitor   Hospital day # 2  Gevena Mart DNP, ACNPC-AG  [facility]  I have personally obtained history,examined this patient, reviewed notes, independently viewed imaging studies, participated in medical decision making and plan of care.ROS completed by me personally and pertinent positives fully documented  I have made any additions or clarifications directly to the above note. Agree with note above.  Patient neurological exam continues to decline with unreactive pupils and sluggish corneal reflex in his bed with reviewing the ventilator and have no motor responses.  Serum sodium remains high hypertonic saline drip of.  Chances of patient progressing to brain death and passing away soon very high.  Had a long discussion after rounds of prolonged family meeting with the patient's father, ex-wife, 2 sons and other family members about his extremely poor prognosis the futility of prolonging life life support and supporting him.  Family struggling with decision and understand risks and agree with DNR and not yet ready to withdraw support and will let us know their  decision soon.  Discussed with Dr. Everardo All critical care medicine. This patient is critically ill and at significant risk of neurological worsening, death and care requires constant monitoring of vital signs, hemodynamics,respiratory and cardiac monitoring, extensive review of multiple databases, frequent neurological assessment, discussion with family, other specialists and medical decision making of high complexity.I have made any additions or clarifications directly to the above note.This critical care time does not reflect procedure time, or teaching time or supervisory time of PA/NP/Med Resident etc but could involve care discussion time.  I spent 45 minutes of neurocritical care time  in the care of  this patient.     Delia Heady, MD Medical  Director [facility] Pager: [phone] 09/21/2022 2:40 PM    To contact Stroke Continuity provider, please refer to [email or web address]. After hours, contact General Neurology

## 2022-09-22 ENCOUNTER — Encounter (HOSPITAL_COMMUNITY): Payer: Self-pay | Admitting: Anesthesiology

## 2022-09-22 LAB — SODIUM
Sodium: 175 mmol/L (ref 135–145)
Sodium: 176 mmol/L (ref 135–145)

## 2022-09-22 LAB — GLUCOSE, CAPILLARY: Glucose-Capillary: 143 mg/dL — ABNORMAL HIGH (ref 70–99)

## 2022-09-22 LAB — OSMOLALITY, URINE: Osmolality, Ur: 700 mosm/kg (ref 300–900)

## 2022-09-22 MED ORDER — SODIUM CHLORIDE 0.9 % IV BOLUS: 500.0000 mL | Freq: Once | INTRAVENOUS | Status: DC

## 2022-09-22 MED ORDER — SODIUM CHLORIDE 0.9 % IV SOLN: INTRAVENOUS | Status: DC | PRN

## 2022-09-22 MED ORDER — NOREPINEPHRINE 4 MG/250ML-% IV SOLN: 0.0000 ug/min | INTRAVENOUS | Status: DC

## 2022-09-22 MED ORDER — MIDAZOLAM HCL 2 MG/2ML IJ SOLN
0.5000 mg | Freq: Four times a day (QID) | INTRAMUSCULAR | Status: DC | PRN
  Administered 2022-09-22: 1 mg via INTRAVENOUS
  Filled 2022-09-22: qty 2

## 2022-09-22 MED ORDER — FENTANYL CITRATE PF 50 MCG/ML IJ SOSY: 25.0000 ug | PREFILLED_SYRINGE | INTRAMUSCULAR | Status: DC | PRN

## 2022-10-10 NOTE — Progress Notes (Signed)
Patient with RR of 35-40 and HR in the 150's. After discussion with RT and CCM MD, the decision was made to restart fentanyl gtt and monitor further.

## 2022-10-10 NOTE — Progress Notes (Signed)
eLink Physician-Brief Progress Note Patient Name: Marcus Wall DOB: [birth year] MRN: [id number]   Date of Service  09/17/2022  HPI/Events of Note  Tried to call patient's son [name], it went to voicemail and I left him a message to call back to E-Link.  eICU Interventions  See above.        Thomasene Lot Cristal Qadir 09/21/2022, 5:14 AM

## 2022-10-10 NOTE — Progress Notes (Signed)
eLink Physician-Brief Progress Note Patient Name: Marcus Wall DOB: 1960/11/18 MRN: [id number]   Date of Service  09/29/2022  HPI/Events of Note  BP 72/48  eICU Interventions  Levophed gtt ordered.        Migdalia Dk 09/14/2022, 4:43 AM

## 2022-10-10 NOTE — Progress Notes (Signed)
eLink Physician-Brief Progress Note Patient Name: Marcus Wall DOB: Apr 17, 1960 MRN: [id number]   Date of Service  09/12/2022  HPI/Events of Note  I tried to reach listed next of kin , son Marcus Wall to inform him of his father's death but there was a recorded message indicating that he could not be reached at this time.  eICU Interventions          Migdalia Dk 09/23/2022, 5:06 AM

## 2022-10-10 NOTE — Progress Notes (Addendum)
This RN assessed patient after BP and HR were declining. MD made aware. Multiple heart rhythms noticed, ranging from ST to SB, O2 Sats dropped into the 80's, and BP low. MD camera into room, Patient then asystole. Patient noted to be a DNR. This RN and Aline Brochure RN auscultated no heart sounds, TOD 0447. Ventilator turned off at this time. Neurology MD also made aware of TOD. ME called.

## 2022-10-10 DEATH — deceased

## 2022-11-10 NOTE — Discharge Summary (Signed)
Patient ID: Marcus Wall MRN: [id number] DOB/AGE: 1960-06-19 62 y.o.  Admit date: October 05, 2022 Death date: 10/08/22 0447  Admission Diagnosesstroke  Cause of Death: Large left brain stroke due to terminal ICA occlusion status post mechanical thrombectomy with revascularization with subsequent hemorrhagic transformation s/p decompressive craniotomy.  Patient made DNR as per family wishes Pertinent Medical Diagnosis: Principal Problem:   Acute ischemic left MCA stroke (HCC) Active Problems:   Middle cerebral artery embolism, left   Cerebral edema (HCC) Brain herniation Respiratory failure Hypertensive emergency Hyperlipidemia Borderline diabetes Tobacco abuse Hypocalcemia Right upper lobe atelectasis versus aspiration Bradycardia due to brain herniation   Hospital Course: Marcus Wall is a 62 y.o. male with history of hypertension who presented as a code stroke due to right-sided weakness, left gaze preference, vomiting, and right hemianopsia.  Patient's last known well was 8 PM 9/9.  He was then found around 1:42 AM.  Friend states he saw him hanging off the bed, making noises, vomiting, moving only his left side.  Per EMS entrance port patient's heart rate would drop to 30s and required intermittent pacing.  He was also hypertensive up to the 200s systolic. CT head with hyperdense left MCA with developing left MCA stroke.  CTA with occlusion of the left ICA at bifurcation, occluded all the way to left MCA.   SIGNIFICANT HOSPITAL EVENTS 9/9: Taken for thrombectomy of L ACA A1, MCA Mi with TICI 3 revascularization October 05, 2022 urgent left hemispheric decompressive hemicraniectomy for cerebral edema However patient's neurological exam and condition remained quite poor despite decompressive hemicraniectomy with follow-up CT scan showing large amount of subarachnoid blood over the left hemispheres, subfalcine and uncal herniation with rightward midline shift of 19 mm with  widespread cytotoxic edema throughout the left cerebral hemisphere.  Patient was kept intubated for respiratory failure however neurological exam prognosis is poor.  Family understood this and made the patient DNR by family.  He became a systolic and was pronounced dead by RN on October 08, 2022 at 0447 hrs. Signed: Delia Heady 11/06/2022, 5:53 PM
